# Patient Record
Sex: Female | Born: 1980 | Hispanic: Yes | Marital: Single | Smoking: Former smoker
Health system: Southern US, Community
[De-identification: ages and names within clinical notes are randomized; demographics above are authoritative.]

## PROBLEM LIST (undated history)

## (undated) ENCOUNTER — Inpatient Hospital Stay (HOSPITAL_COMMUNITY): Payer: Self-pay

## (undated) DIAGNOSIS — I10 Essential (primary) hypertension: Secondary | ICD-10-CM

## (undated) DIAGNOSIS — N6001 Solitary cyst of right breast: Secondary | ICD-10-CM

## (undated) HISTORY — DX: Solitary cyst of right breast: N60.01

---
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
## 2007-01-13 ENCOUNTER — Ambulatory Visit (HOSPITAL_COMMUNITY): Admission: RE | Admit: 2007-01-13 | Discharge: 2007-01-13 | Payer: Self-pay | Admitting: Obstetrics and Gynecology

## 2007-01-27 ENCOUNTER — Ambulatory Visit (HOSPITAL_COMMUNITY): Admission: RE | Admit: 2007-01-27 | Discharge: 2007-01-27 | Payer: Self-pay | Admitting: Obstetrics and Gynecology

## 2007-04-07 ENCOUNTER — Ambulatory Visit (HOSPITAL_COMMUNITY): Admission: RE | Admit: 2007-04-07 | Discharge: 2007-04-07 | Payer: Self-pay | Admitting: Obstetrics and Gynecology

## 2010-07-12 ENCOUNTER — Encounter: Payer: Self-pay | Admitting: *Deleted

## 2015-05-07 DIAGNOSIS — N6001 Solitary cyst of right breast: Secondary | ICD-10-CM | POA: Insufficient documentation

## 2016-02-20 HISTORY — PX: DILATION AND CURETTAGE OF UTERUS: SHX78

## 2017-04-06 ENCOUNTER — Encounter (HOSPITAL_COMMUNITY): Payer: Self-pay | Admitting: *Deleted

## 2017-04-06 ENCOUNTER — Inpatient Hospital Stay (HOSPITAL_COMMUNITY): Payer: Self-pay

## 2017-04-06 ENCOUNTER — Inpatient Hospital Stay (HOSPITAL_COMMUNITY)
Admission: AD | Admit: 2017-04-06 | Discharge: 2017-04-06 | Disposition: A | Discharge status: Home / Self Care | Payer: Self-pay | Source: Ambulatory Visit | Attending: Obstetrics and Gynecology | Admitting: Obstetrics and Gynecology

## 2017-04-06 DIAGNOSIS — O26899 Other specified pregnancy related conditions, unspecified trimester: Secondary | ICD-10-CM

## 2017-04-06 DIAGNOSIS — O3680X0 Pregnancy with inconclusive fetal viability, not applicable or unspecified: Secondary | ICD-10-CM

## 2017-04-06 DIAGNOSIS — R109 Unspecified abdominal pain: Secondary | ICD-10-CM

## 2017-04-06 DIAGNOSIS — Z62819 Personal history of unspecified abuse in childhood: Secondary | ICD-10-CM

## 2017-04-06 DIAGNOSIS — O26891 Other specified pregnancy related conditions, first trimester: Secondary | ICD-10-CM

## 2017-04-06 DIAGNOSIS — Z3A01 Less than 8 weeks gestation of pregnancy: Secondary | ICD-10-CM | POA: Insufficient documentation

## 2017-04-06 DIAGNOSIS — O10011 Pre-existing essential hypertension complicating pregnancy, first trimester: Secondary | ICD-10-CM | POA: Insufficient documentation

## 2017-04-06 DIAGNOSIS — O10911 Unspecified pre-existing hypertension complicating pregnancy, first trimester: Secondary | ICD-10-CM

## 2017-04-06 HISTORY — DX: Essential (primary) hypertension: I10

## 2017-04-06 LAB — URINALYSIS, ROUTINE W REFLEX MICROSCOPIC
Bilirubin Urine: NEGATIVE
GLUCOSE, UA: NEGATIVE mg/dL
Hgb urine dipstick: NEGATIVE
KETONES UR: NEGATIVE mg/dL
LEUKOCYTES UA: NEGATIVE
Nitrite: NEGATIVE
PROTEIN: NEGATIVE mg/dL
Specific Gravity, Urine: 1.025 (ref 1.005–1.030)
pH: 6 (ref 5.0–8.0)

## 2017-04-06 LAB — WET PREP, GENITAL
Clue Cells Wet Prep HPF POC: NONE SEEN
Sperm: NONE SEEN
TRICH WET PREP: NONE SEEN
Yeast Wet Prep HPF POC: NONE SEEN

## 2017-04-06 LAB — HCG, QUANTITATIVE, PREGNANCY: HCG, BETA CHAIN, QUANT, S: 628 m[IU]/mL — AB (ref <5)

## 2017-04-06 MED ORDER — LABETALOL HCL 100 MG PO TABS: 100.0000 mg | ORAL_TABLET | Freq: Two times a day (BID) | ORAL | 3 refills | Status: DC

## 2017-04-06 NOTE — MAU Note (Signed)
[facility]. [facility]. She also reports lower left abdomianl pain that started one week ago.

## 2017-04-06 NOTE — Discharge Instructions (Signed)
Dolor abdominal en el embarazo °(Abdominal Pain During Pregnancy) °El dolor abdominal es frecuente durante el embarazo. Generalmente no causa ningún daño. El dolor abdominal puede tener numerosas causas. Algunas causas son más graves que otras. Ciertas causas de dolor abdominal durante el embarazo se diagnostican fácilmente. A veces, se tarda un tiempo para llegar al diagnóstico. Otras veces la causa no se conoce. El dolor abdominal puede estar relacionado con alguna alteración del embarazo, o puede deberse a una causa totalmente diferente. Por este motivo, siempre consulte a su médico cuando sienta molestias abdominales. °INSTRUCCIONES PARA EL CUIDADO EN EL HOGAR  °Esté atenta al dolor para ver si hay cambios. Las siguientes indicaciones ayudarán a aliviar cualquier molestia que pueda sentir: °· No tenga relaciones sexuales y no coloque nada dentro de la vagina hasta que los síntomas hayan desaparecido completamente. °· Descanse todo lo que pueda hasta que el dolor se le haya calmado. °· Si siente náuseas, beba líquidos claros. Evite los alimentos sólidos mientras sienta malestar o tenga náuseas. °· Tome sólo medicamentos de venta libre o recetados, según las indicaciones del médico. °· Cumpla con todas las visitas de control, según le indique su médico. °SOLICITE ATENCIÓN MÉDICA DE INMEDIATO SI: °· Tiene un sangrado, pérdida de líquidos o elimina tejidos por la vagina. °· El dolor o los cólicos aumentan. °· Tiene vómitos persistentes. °· Comienza a sentir dolor al orinar u observa sangre. °· Tiene fiebre. °· Nota que los movimientos del bebé disminuyen. °· Siente intensa debilidad o se marea. °· Tiene dificultad para respirar con o sin dolor abdominal. °· Siente un dolor de cabeza intenso junto al dolor abdominal. °· Tiene una secreción vaginal anormal con dolor abdominal. °· Tiene diarrea persistente. °· El dolor abdominal sigue o empeora aún después de hacer reposo. °ASEGÚRESE DE QUE:  °· Comprende estas  instrucciones. °· Controlará su afección. °· Recibirá ayuda de inmediato si no mejora o si empeora. °Esta información no tiene como fin reemplazar el consejo del médico. Asegúrese de hacerle al médico cualquier pregunta que tenga. °Document Released: 06/07/2005 Document Revised: 09/29/2015 °Elsevier Interactive Patient Education © 2017 Elsevier Inc. ° °
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
## 2017-04-13 ENCOUNTER — Inpatient Hospital Stay (HOSPITAL_COMMUNITY)
Admission: AD | Admit: 2017-04-13 | Discharge: 2017-04-13 | Disposition: A | Discharge status: Home / Self Care | Payer: Self-pay | Source: Ambulatory Visit | Attending: Obstetrics and Gynecology | Admitting: Obstetrics and Gynecology

## 2017-04-13 ENCOUNTER — Inpatient Hospital Stay (HOSPITAL_COMMUNITY): Payer: Self-pay

## 2017-04-13 ENCOUNTER — Encounter (HOSPITAL_COMMUNITY): Payer: Self-pay | Admitting: Student

## 2017-04-13 ENCOUNTER — Ambulatory Visit (INDEPENDENT_AMBULATORY_CARE_PROVIDER_SITE_OTHER): Payer: Self-pay | Admitting: Advanced Practice Midwife

## 2017-04-13 DIAGNOSIS — O3680X0 Pregnancy with inconclusive fetal viability, not applicable or unspecified: Secondary | ICD-10-CM

## 2017-04-13 DIAGNOSIS — Z3A01 Less than 8 weeks gestation of pregnancy: Secondary | ICD-10-CM | POA: Insufficient documentation

## 2017-04-13 DIAGNOSIS — O10011 Pre-existing essential hypertension complicating pregnancy, first trimester: Secondary | ICD-10-CM | POA: Insufficient documentation

## 2017-04-13 DIAGNOSIS — O10911 Unspecified pre-existing hypertension complicating pregnancy, first trimester: Secondary | ICD-10-CM

## 2017-04-13 DIAGNOSIS — O26891 Other specified pregnancy related conditions, first trimester: Secondary | ICD-10-CM

## 2017-04-13 DIAGNOSIS — R1032 Left lower quadrant pain: Secondary | ICD-10-CM | POA: Insufficient documentation

## 2017-04-13 DIAGNOSIS — O26899 Other specified pregnancy related conditions, unspecified trimester: Secondary | ICD-10-CM

## 2017-04-13 DIAGNOSIS — R109 Unspecified abdominal pain: Secondary | ICD-10-CM

## 2017-04-13 DIAGNOSIS — Z3491 Encounter for supervision of normal pregnancy, unspecified, first trimester: Secondary | ICD-10-CM

## 2017-04-13 DIAGNOSIS — O99331 Smoking (tobacco) complicating pregnancy, first trimester: Secondary | ICD-10-CM | POA: Insufficient documentation

## 2017-04-13 DIAGNOSIS — F17210 Nicotine dependence, cigarettes, uncomplicated: Secondary | ICD-10-CM | POA: Insufficient documentation

## 2017-04-13 LAB — POCT URINALYSIS DIP (DEVICE)
Bilirubin Urine: NEGATIVE
Glucose, UA: NEGATIVE mg/dL
HGB URINE DIPSTICK: NEGATIVE
KETONES UR: NEGATIVE mg/dL
Leukocytes, UA: NEGATIVE
Nitrite: NEGATIVE
PH: 5.5 (ref 5.0–8.0)
PROTEIN: NEGATIVE mg/dL
SPECIFIC GRAVITY, URINE: 1.015 (ref 1.005–1.030)
Urobilinogen, UA: 0.2 mg/dL (ref 0.0–1.0)

## 2017-04-13 LAB — URINALYSIS, ROUTINE W REFLEX MICROSCOPIC
Bilirubin Urine: NEGATIVE
Glucose, UA: NEGATIVE mg/dL
HGB URINE DIPSTICK: NEGATIVE
Ketones, ur: NEGATIVE mg/dL
Leukocytes, UA: NEGATIVE
NITRITE: NEGATIVE
PROTEIN: NEGATIVE mg/dL
Specific Gravity, Urine: 1.025 (ref 1.005–1.030)
pH: 5 (ref 5.0–8.0)

## 2017-04-13 LAB — CBC
HEMATOCRIT: 39.1 % (ref 36.0–46.0)
Hemoglobin: 13.4 g/dL (ref 12.0–15.0)
MCH: 31.8 pg (ref 26.0–34.0)
MCHC: 34.3 g/dL (ref 30.0–36.0)
MCV: 92.7 fL (ref 78.0–100.0)
PLATELETS: 291 10*3/uL (ref 150–400)
RBC: 4.22 MIL/uL (ref 3.87–5.11)
RDW: 13.3 % (ref 11.5–15.5)
WBC: 10.1 10*3/uL (ref 4.0–10.5)

## 2017-04-13 LAB — HCG, QUANTITATIVE, PREGNANCY: HCG, BETA CHAIN, QUANT, S: 9882 m[IU]/mL — AB (ref <5)

## 2017-04-13 LAB — ABO/RH: ABO/RH(D): O POS

## 2017-04-13 NOTE — Progress Notes (Signed)
Subjective:     Patient ID: Julie Ellis, female   DOB: 03/01/1981, 36 y.o.   MRN: [id number]  Julie Jeffersonely Ellis is a 36 y.o. [id number] at Unknown gestational age who presents left lower quadrant pain. [facility] of [facility] and diagnosed with a miscarriage.  Patient states she had heavy bleeding with clots for 2-3 hours after being seen with cramping that has since stopped. She states the first week of October she had 3 days of bleeding that she feels like was a period and none since. Patient has paperwork with her from 2017 that matches what she describes, but patient is saying this occurred last month.   Currently, she is reporting left lower quadrant pain that has continued since her miscarriage. She rates the pain a 4/10 and describes it as burning. She has not tried anything for the pain. She states [facility] who told her she was pregnant and recommended she come to MAU to be evaluated for high blood pressure. On 10/17, patient had a hcg of 628 and an ultrasound with a possible gestational sac and no yolk sac. Patient was supposed to follow up in 48 hours for a hcg but did not come back because she thought she was supposed to establish prenatal care.     Review of Systems  Constitutional: Positive for fatigue. Negative for activity change and fever.  Gastrointestinal: Positive for abdominal pain.  Genitourinary: Negative for vaginal bleeding and vaginal discharge.  Psychiatric/Behavioral: Negative.        Objective:   Physical Exam  Constitutional: She is oriented to person, place, and time. She appears well-developed and well-nourished.  HENT:  Head: Normocephalic.  Cardiovascular: Normal rate, regular rhythm and normal heart sounds.   Pulmonary/Chest: Effort normal and breath sounds normal.  Abdominal: Soft. Bowel sounds are normal. She exhibits no distension. There is tenderness in the suprapubic  area and left lower quadrant.  Neurological: She is alert and oriented to person, place, and time.  Skin: Skin is warm and dry.  Psychiatric: She has a normal mood and affect. Her behavior is normal. Judgment and thought content normal.  Nursing note and vitals reviewed.     Assessment:     1. Pregnancy of unknown anatomic location   2. Left lower quadrant pain       Plan:     -Patient to MAU for further evaluation and management  Rolm BookbinderCaroline M Neill, CNM 04/13/17 3:30 PM

## 2017-04-13 NOTE — Discharge Instructions (Signed)
Dolor abdominal en el embarazo °(Abdominal Pain During Pregnancy) °El dolor abdominal es frecuente durante el embarazo. Generalmente no causa ningún daño. El dolor abdominal puede tener numerosas causas. Algunas causas son más graves que otras. Ciertas causas de dolor abdominal durante el embarazo se diagnostican fácilmente. A veces, se tarda un tiempo para llegar al diagnóstico. Otras veces la causa no se conoce. El dolor abdominal puede estar relacionado con alguna alteración del embarazo, o puede deberse a una causa totalmente diferente. Por este motivo, siempre consulte a su médico cuando sienta molestias abdominales. °INSTRUCCIONES PARA EL CUIDADO EN EL HOGAR  °Esté atenta al dolor para ver si hay cambios. Las siguientes indicaciones ayudarán a aliviar cualquier molestia que pueda sentir: °· No tenga relaciones sexuales y no coloque nada dentro de la vagina hasta que los síntomas hayan desaparecido completamente. °· Descanse todo lo que pueda hasta que el dolor se le haya calmado. °· Si siente náuseas, beba líquidos claros. Evite los alimentos sólidos mientras sienta malestar o tenga náuseas. °· Tome sólo medicamentos de venta libre o recetados, según las indicaciones del médico. °· Cumpla con todas las visitas de control, según le indique su médico. °SOLICITE ATENCIÓN MÉDICA DE INMEDIATO SI: °· Tiene un sangrado, pérdida de líquidos o elimina tejidos por la vagina. °· El dolor o los cólicos aumentan. °· Tiene vómitos persistentes. °· Comienza a sentir dolor al orinar u observa sangre. °· Tiene fiebre. °· Nota que los movimientos del bebé disminuyen. °· Siente intensa debilidad o se marea. °· Tiene dificultad para respirar con o sin dolor abdominal. °· Siente un dolor de cabeza intenso junto al dolor abdominal. °· Tiene una secreción vaginal anormal con dolor abdominal. °· Tiene diarrea persistente. °· El dolor abdominal sigue o empeora aún después de hacer reposo. °ASEGÚRESE DE QUE:  °· Comprende estas  instrucciones. °· Controlará su afección. °· Recibirá ayuda de inmediato si no mejora o si empeora. °Esta información no tiene como fin reemplazar el consejo del médico. Asegúrese de hacerle al médico cualquier pregunta que tenga. °Document Released: 06/07/2005 Document Revised: 09/29/2015 °Elsevier Interactive Patient Education © 2017 Elsevier Inc. ° °

## 2017-04-13 NOTE — Patient Instructions (Signed)
Embarazo ectópico °(Ectopic Pregnancy) °Un embarazo ectópico ocurre cuando un óvulo fecundado se desarrolla fuera del útero. Un embarazo no puede subsistir fuera del útero. Este problema generalmente ocurre en las trompas de Falopio. La causa es, con frecuencia, un daño en la trompa de Falopio. °Si el problema se detecta a tiempo, puede tratarse con medicamentos. Si el conducto se fisura o estalla (hay ruptura), tendrá una hemorragia interna. Esto es una emergencia. Deberá someterse a una cirugía. Solicite ayuda de inmediato. °SÍNTOMAS °Al principio puede tener síntomas de un embarazo normal. Estos pueden ser: °· Falta del período menstrual. °· Ganas de vomitar (náuseas). °· Sensación de cansancio. °· Hinchazón de las mamas. °Luego podrá a comenzar a tener síntomas que no son normales. Estos pueden ser: °· Dolor durante el coito (relación sexual). °· Hemorragia por la vagina. Esto incluye el sangrado leve (pérdidas). °· Calambres o dolor en el vientre (abdomen) o en la zona inferior del vientre. Estos pueden sentirse en uno de los lados. °· Latidos cardíacos rápidos (pulso). °· Perder la conciencia (desmayarse) después de ir de cuerpo (defecar). °Si el conducto se rompe, puede tener síntomas como: °· Dolor muy intenso en el vientre o parte baja del vientre. Esto se produce repentinamente. °· Mareos. °· Desmayo. °· Dolor en el hombro. °SOLICITE AYUDA DE INMEDIATO SI: °Tiene alguno de estos síntomas. Esto es una emergencia. °ASEGÚRESE DE QUE: °· Comprende estas instrucciones. °· Controlará su afección. °· Recibirá ayuda de inmediato si no mejora o si empeora. ° °Esta información no tiene como fin reemplazar el consejo del médico. Asegúrese de hacerle al médico cualquier pregunta que tenga. °Document Released: 05/27/2011 Document Revised: 06/12/2013 Document Reviewed: 01/17/2013 °Elsevier Interactive Patient Education © 2017 Elsevier Inc. ° °

## 2017-04-13 NOTE — MAU Note (Signed)
Sent up from clinic due to hypertension.

## 2017-04-13 NOTE — MAU Provider Note (Signed)
History     CSN: [id number]  Arrival date and time: 04/13/17 1533   First Provider Initiated Contact with Patient 04/13/17 1614      Chief Complaint  Patient presents with  . Abdominal Pain   HPI Julie Ellis is a 36 y.o. [id number] at 2746w2d who presents from the clinic for ectopic workup. Patient seen in MAU last week; had ultrasound that showed questionable IUGS & a BHCG of ~600. Was instructed to f/u in 48 hours for repeat BHCG but thought she was told to start prenatal care. Went for initial OB visit in the clinic today & told provider about abdominal pain so was sent here for stat labs & ultrasound.  Reports intermittent suprapubic & LLQ pain for the last few days. Rates pain 7/10. Has not treated. Denies vomiting, diarrhea, constipation, dysuria, vaginal bleeding, or vaginal discharge.  OB History    Gravida Para Term Preterm AB Living   5 3 3   1 3    SAB TAB Ectopic Multiple Live Births   1       3      Past Medical History:  Diagnosis Date  . Hypertension    took herself off meds 1 1/2years ago    Past Surgical History:  Procedure Laterality Date  . DILATION AND CURETTAGE OF UTERUS      History reviewed. No pertinent family history.  Social History  Substance Use Topics  . Smoking status: Current Every Day Smoker    Packs/day: 0.50    Types: Cigarettes  . Smokeless tobacco: Never Used  . Alcohol use No    Allergies: No Known Allergies  Prescriptions Prior to Admission  Medication Sig Dispense Refill Last Dose  . folic acid (FOLVITE) 800 MCG tablet Take 800 mcg by mouth daily.     Marland Kitchen. labetalol (NORMODYNE) 100 MG tablet Take 1 tablet (100 mg total) by mouth 2 (two) times daily. 60 tablet 3     Review of Systems  Constitutional: Negative for chills and fever.  Gastrointestinal: Positive for abdominal pain and nausea. Negative for constipation, diarrhea and vomiting.  Genitourinary: Negative for dysuria, vaginal bleeding and vaginal discharge.    Physical Exam   Blood pressure (!) 123/91, pulse 79, temperature 97.8 F (36.6 C), temperature source Oral, resp. rate 16, last menstrual period 03/07/2017.  Physical Exam  Nursing note and vitals reviewed. Constitutional: She is oriented to person, place, and time. She appears well-developed and well-nourished. No distress.  HENT:  Head: Normocephalic and atraumatic.  Eyes: Conjunctivae are normal. Right eye exhibits no discharge. Left eye exhibits no discharge. No scleral icterus.  Neck: Normal range of motion.  Respiratory: Effort normal. No respiratory distress.  GI: Soft. She exhibits no distension. There is no tenderness. There is no rebound and no guarding.  Neurological: She is alert and oriented to person, place, and time.  Skin: Skin is warm and dry. She is not diaphoretic.  Psychiatric: She has a normal mood and affect. Her behavior is normal. Judgment and thought content normal.    MAU Course  Procedures Results for orders placed or performed during the hospital encounter of 04/13/17 (from the past 24 hour(s))  Urinalysis, Routine w reflex microscopic     Status: Abnormal   Collection Time: 04/13/17  3:33 PM  Result Value Ref Range   Color, Urine YELLOW YELLOW   APPearance CLEAR CLEAR   Specific Gravity, Urine 1.025 1.005 - 1.030   pH 5.0 5.0 - 8.0   Glucose, UA  NEGATIVE NEGATIVE mg/dL   Hgb urine dipstick NEGATIVE NEGATIVE   Bilirubin Urine NEGATIVE NEGATIVE   Ketones, ur NEGATIVE NEGATIVE mg/dL   Protein, ur NEGATIVE NEGATIVE mg/dL   Nitrite NEGATIVE NEGATIVE   Leukocytes, UA NEGATIVE NEGATIVE   RBC / HPF 0-5 0 - 5 RBC/hpf   WBC, UA 0-5 0 - 5 WBC/hpf   Bacteria, UA RARE (A) NONE SEEN   Squamous Epithelial / LPF 0-5 (A) NONE SEEN   Mucus PRESENT   CBC     Status: None   Collection Time: 04/13/17  4:26 PM  Result Value Ref Range   WBC 10.1 4.0 - 10.5 K/uL   RBC 4.22 3.87 - 5.11 MIL/uL   Hemoglobin 13.4 12.0 - 15.0 g/dL   HCT 40.9 81.1 - 91.4 %   MCV  92.7 78.0 - 100.0 fL   MCH 31.8 26.0 - 34.0 pg   MCHC 34.3 30.0 - 36.0 g/dL   RDW 78.2 95.6 - 21.3 %   Platelets 291 150 - 400 K/uL  ABO/Rh     Status: None (Preliminary result)   Collection Time: 04/13/17  4:26 PM  Result Value Ref Range   ABO/RH(D) O POS   hCG, quantitative, pregnancy     Status: Abnormal   Collection Time: 04/13/17  4:26 PM  Result Value Ref Range   hCG, Beta Chain, Quant, S 9,882 (H) <5 mIU/mL   US Ob Transvaginal  Result Date: 04/13/2017 CLINICAL DATA:  Vaginal bleeding in first trimester pregnancy, abdominal pain EXAM: TRANSVAGINAL OB ULTRASOUND TECHNIQUE: Transvaginal ultrasound was performed for complete evaluation of the gestation as well as the maternal uterus, adnexal regions, and pelvic cul-de-sac. COMPARISON:  04/06/2017 FINDINGS: Intrauterine gestational sac: Present Yolk sac:  Present Embryo:  Not identified Cardiac Activity: N/A Heart Rate: N/A bpm MSD: 9.5  mm   5 w   5  d Subchorionic hemorrhage:  Small subchronic hemorrhage 10 x 5 x 9 mm Maternal uterus/adnexae: RIGHT ovary normal size and morphology 4.1 x 2.0 x 2.7 cm. LEFT ovary normal size and morphology 2.2 x 0.9 x 1.8 cm. Trace free pelvic fluid. No adnexal masses. IMPRESSION: Intrauterine gestational sac identified containing a yolk sac. No definite fetal pole visualized to establish viability. Small subchronic hemorrhage. Electronically Signed   By: Ulyses Southward M.D.   On: 04/13/2017 17:39    MDM CBC, BHCG, abo/rh, ultrasound Ultrasound shows IUGS with yolk sac which confirms intrauterine pregnancy Discussed results with patient. Will have her reschedule her new OB visit Pt reports resolution of pain without intervention Assessment and Plan  A: 1. Normal IUP (intrauterine pregnancy) on prenatal ultrasound, first trimester   2. Abdominal pain affecting pregnancy   3. Maternal chronic hypertension in first trimester   4. Less than [redacted] weeks gestation of pregnancy    P: Discharge home Discussed  reasons to return to MAU Msg to [facility] to reschedule initial OB visit   Judeth Horn 04/13/2017, 4:15 PM

## 2017-04-13 NOTE — MAU Note (Signed)
Sent from clinic, initial prenatal appt today.  ? New preg, reports SAB end of Sept.  Presents today with LLQ pain.

## 2017-04-28 ENCOUNTER — Telehealth: Payer: Self-pay | Admitting: General Practice

## 2017-04-28 NOTE — Telephone Encounter (Signed)
Called and notified patient (with Interpreter) of New OB appointment with Thressa ShellerHeather Hogan, CNM on 05/23/17 at 9:20am.  Patient verbalized understanding.

## 2017-05-23 ENCOUNTER — Encounter: Payer: Self-pay | Admitting: Advanced Practice Midwife

## 2017-05-23 ENCOUNTER — Encounter: Payer: Self-pay | Admitting: General Practice

## 2017-05-23 ENCOUNTER — Ambulatory Visit (INDEPENDENT_AMBULATORY_CARE_PROVIDER_SITE_OTHER): Payer: Self-pay | Admitting: Advanced Practice Midwife

## 2017-05-23 DIAGNOSIS — O10911 Unspecified pre-existing hypertension complicating pregnancy, first trimester: Secondary | ICD-10-CM

## 2017-05-23 DIAGNOSIS — O0991 Supervision of high risk pregnancy, unspecified, first trimester: Secondary | ICD-10-CM

## 2017-05-23 DIAGNOSIS — N6001 Solitary cyst of right breast: Secondary | ICD-10-CM

## 2017-05-23 DIAGNOSIS — O10919 Unspecified pre-existing hypertension complicating pregnancy, unspecified trimester: Secondary | ICD-10-CM | POA: Insufficient documentation

## 2017-05-23 DIAGNOSIS — Z113 Encounter for screening for infections with a predominantly sexual mode of transmission: Secondary | ICD-10-CM

## 2017-05-23 HISTORY — DX: Solitary cyst of right breast: N60.01

## 2017-05-23 LAB — POCT URINALYSIS DIP (DEVICE)
BILIRUBIN URINE: NEGATIVE
GLUCOSE, UA: NEGATIVE mg/dL
Hgb urine dipstick: NEGATIVE
Ketones, ur: NEGATIVE mg/dL
NITRITE: NEGATIVE
Protein, ur: NEGATIVE mg/dL
Specific Gravity, Urine: 1.025 (ref 1.005–1.030)
UROBILINOGEN UA: 0.2 mg/dL (ref 0.0–1.0)
pH: 6 (ref 5.0–8.0)

## 2017-05-23 MED ORDER — ASPIRIN EC 81 MG PO TBEC: 81.0000 mg | DELAYED_RELEASE_TABLET | Freq: Every day | ORAL | 9 refills | Status: DC

## 2017-05-23 MED ORDER — PRENATAL VITAMINS 0.8 MG PO TABS: 1.0000 | ORAL_TABLET | Freq: Every day | ORAL | 12 refills | Status: DC

## 2017-05-23 MED ORDER — LABETALOL HCL 200 MG PO TABS: 200.0000 mg | ORAL_TABLET | Freq: Two times a day (BID) | ORAL | 9 refills | Status: DC

## 2017-05-23 NOTE — Patient Instructions (Signed)
Plan de alimentacin para mujeres embarazadas (Eating Plan for Pregnant Women) Durante el [place] Glenvilleembarazo, el cuerpo Pension scheme managernecesitar recibir alimentacin extra para brindar sustento al beb que est creciendo. Se recomienda que consuma lo siguiente:  150caloras extra diariamente Physicist, medicaldurante el primer trimestre.  300caloras extra diariamente durante el segundo trimestre.  300caloras extra diariamente durante el tercer trimestre. Comer una dieta saludable y bien equilibrada es muy importante para su salud y la del beb. Adems, usted tiene ms necesidad de recibir algunas vitaminas y Erskineminerales, como cido flico, calcio, hierro y vitaminaD. QU DEBO SABER ACERCA DE LA ALIMENTACIN DURANTE EL EMBARAZO?  No trate de Geophysical data processoradelgazar o hacer una dieta durante el embarazo.  Elija alimentos nutritivos y saludables. Opte por la mitad de un sndwich con un vaso de Primary school teacherleche en lugar de una barra de Hillcrest Heightsdulce o una bebida endulzada con azcar que contenga muchas caloras.  Limite el consumo general de alimentos con "caloras vacas". Entre los alimentos con poco valor nutritivo se Office Depotincluyen los dulces, los postres, los Viequescaramelos, las bebidas endulzadas con International aid/development workerazcar y los alimentos fritos.  Consuma una amplia variedad de alimentos, especialmente frutas y verduras.  Tome una vitamina prenatal para ayudar a Patent examinersatisfacer las necesidades adicionales durante el [place] Cityembarazo, especficamente de cido flico, hierro, calcio y vitaminaD.  Haga actividad fsica. Pdale al mdico que le recomiende ejercicios que sean especficos para usted.  Cuide la salubridad y la higiene en relacin con los alimentos, por ejemplo, lvese las manos antes de comer y despus de preparar carne cruda. Esto ayuda a USAAevitar las enfermedades de origen alimentario, como la listeriosis, que puede ser muy peligrosa para el beb. Pdale al mdico ms informacin sobre esta enfermedad. CMO SE VEN 150CALORAS EXTRA? Las opciones saludables con 150caloras extra  diarias podran ser cualquiera de las siguientes:  Yogur natural con bajo contenido de grasa (6 a 8onzas [170 a 230g]) con taza de frutos rojos.  1manzana con 2cucharaditas de Singaporemantequilla de man.  Verduras cortadas en trozos con de taza de hummus.  Leche chocolatada con bajo contenido de grasa (8onzas [25040ml] o 1taza).  1tira de queso en hebras con 1naranja mediana.  emparedado de Singaporemantequilla de man y Ghanamermelada en pan integral (1cucharadita de Bowersvillemantequilla de man). Para llegar a 300caloras, podra comer dos de esas opciones saludables diariamente. CUL ES LA CANTIDAD SALUDABLE DE PESO QUE SE DEBE AUMENTAR? La cantidad de peso que es recomendable aumentar depende de su ndice de masa corporal [facility]) antes del embarazo. Si su I[facility] antes del embarazo:  Era menos de 6318 (bajo peso), debe aumentar de 28 a 40 libras (13 a 18kg).  Era de 6718 a 24,9 (normal), debe aumentar de 25 a 35libras (11 a 16kg).  Era de 25 a 29,9 (sobrepeso), debe aumentar de 15 a 25libras (7 a 11kg).  Era de ms de30 (obeso), debe aumentar de 11 a 20libras (5 a 10kg). QU SUCEDE SI ESTOY EMBARAZADA DE GEMELOS O MS BEBS? Generalmente, las embarazadas que tendrn gemelos o ms bebs tal vez deban aumentar la ingesta calrica diaria entre 300 y 600caloras. El rango de aumento de peso total recomendado es de 25 a 54libras (11 a 24kg), en funcin del IMC antes del embarazo. Hable con el mdico para obtener pautas especficas Rohm and Haassobre las necesidades nutritivas [address]adicionales, el aumento de peso y el ejercicio durante el Robinsonembarazo. QU ALIMENTOS PUEDO COMER? Cereales Atmos Energyodos los cereales. Intente elegir los cereales integrales, como el pan integral, la avena o el arroz integral. Verduras Todas las verduras. Intente consumir verduras  de colores y tipos diferentes para obtener una gama completa de vitaminas y minerales. Recuerde lavar bien las verduras antes de comerlas. Frutas Todas las frutas.  Intente consumir frutas de colores y tipos diferentes para obtener una gama completa de vitaminas y minerales. Recuerde lavar bien las frutas antes de comerlas. Carnes y [address]otras fuentes de protenas Carnes Farmlandmagras, entre ellas, [address], [place] Westpavo, pescado y cortes Evelynshiremagros de carne de [place] Pumpvaca, Belizeternera o cerdo. Asegrese de que todas las carnes estn bien cocidas. Tofu. Tempeh. Frijoles. Huevos. Mantequilla de man y Special educational needs teacherotras mantequillas de frutos secos. Mariscos, por ejemplo, camarones, cangrejo y Hernando Beachlangosta. Si opta por los pescados, elija aquellos que tienen un alto contenido de cidos grasos omega-3, incluidos el salmn, el arenque, los Sugar Citymejillones, la West Tawakonitrucha, las sardinas y el abadejo. Asegrese de que todas las carnes estn cocidas a temperaturas que las vuelvan aptas para el consumo. Lcteos Leche y derivados de la leche pasteurizados. Yogur y queso pasteurizados. CSX CorporationQueso cottage. PPG IndustriesCrema cida. CHS IncBebidas Agua. Jugos que contengan jugo de frutas o de verduras al 100%. Ts sin cafena y caf descafeinado. Se pueden tomar bebidas que contengan cafena, pero es mejor evitar esta sustancia. La ingesta total de cafena debe ser de menos de 200mg  diarios (12onzas [33755ml] de caf, t o refresco) o como el mdico se lo haya indicado. Condimentos Todos los condimentos pasteurizados. Dulces y NVR Incpostres Todos los dulces y los postres. Grasas y [address] Avenueaceites Todas las grasas y los aceites. Los artculos mencionados arriba pueden no ser Raytheonuna lista completa de las bebidas o los alimentos recomendados. Comunquese con el nutricionista para conocer ms opciones. QU ALIMENTOS NO SE RECOMIENDAN? Verduras Jugos de verduras no pasteurizados (crudos). Frutas Jugos de frutas no pasteurizados (crudos). Carnes y [address]ras fuentes de protenas Embutidos que contengan nitratos, como Boycepanceta, salame y perros calientes. Embutidos, mortadela u otros fiambres (a menos que estn recalentados al punto de emitir vapor). Pats refrigerados, pastas untables  de carne de una carnicera, frutos de mar ahumados que se encuentran en la seccin de productos refrigerados de Cameroonuna tienda. Pescado crudo, como sushi o sashimi. Pescados con alto contenido de mercurio, por ejemplo, azulejo, tiburn, pez espada y caballa. Carnes crudas, como atn o tartar de carne de res. Carnes y pollo a Merchant navy officermedio cocer. Asegrese de que todas las carnes estn cocidas a temperaturas que las vuelvan aptas para el consumo. Lcteos Leche no pasteurizada (cruda) y cualquier alimento que Omancontenga leche cruda. Quesos blandos, como feta, queso Paulblanco, Centervillequeso fresco, RondoBrie, quesos St. Marysamembert, quesos azules y Aeronautical engineerqueso panela (salvo que estn elaborados con leche pasteurizada, lo cual debe constar en la etiqueta). Bebidas Alcohol. Bebidas endulzadas con azcar, como refrescos, ts o bebidas energizantes. Condimentos Alimentos y bebidas caseros hechos en casa, como pickles, chucrut o bebidas con kombucha (se pueden tomar las versiones que estn pasteurizadas y se vendan en las tiendas). Otros Ensaladas preparadas en la tienda, como ensalada de Treynorjamn, de Altamontpollo, de Athenshuevo, de atn y de frutos de Investment banker, operationalmar. Los artculos mencionados arriba pueden no ser Raytheonuna lista completa de las bebidas y los alimentos que se Theatre stage managerdeben evitar. Comunquese con el nutricionista para obtener ms informacin. Esta informacin no tiene Theme park managercomo fin reemplazar el consejo del mdico. Asegrese de hacerle al mdico cualquier pregunta que tenga. Document Released: 03/22/2014 Document Revised: 09/29/2015 Document Reviewed: 11/20/2013 Elsevier Interactive Patient Education  2018 ArvinMeritorElsevier Inc.  Informacin sobre el dispositivo intrauterino (Intrauterine Device Information) Un dispositivo intrauterino (DIU) se inserta en el tero e impide el embarazo. Hay dos tipos de DIU:  DIU de cobre: este tipo de DIU est recubierto con un alambre de cobre y se inserta dentro del tero. El cobre hace que el tero y las trompas de Falopio produzcan un liquido  que [employer] espermatozoides. El DIU de cobre puede Geneticist, molecular durante 10 aos.  DIU con hormona: este tipo de DIU contiene la hormona progestina (progesterona sinttica). Las hormonas hacen que el moco cervical se haga ms espeso, lo que evita que el esperma ingrese al tero. Tambin hace que la membrana que recubre internamente al tero sea ms delgada lo que impide el implante del vulo fertilizado. La hormona debilita o destruye los espermatozoides que ingresan al tero. Alguno de los tipos de DIU hormonal pueden Geneticist, molecular durante 5 aos y otros tipos pueden dejarse en el lugar por 3 aos. El mdico se asegurar de que usted sea una buena candidata para usar el DIU. Converse con su mdico acerca de los posibles efectos secundarios. VENTAJASDEL DISPOSITIVO INTRAUTERINO  El DIU es muy eficaz, reversible, de accin prolongada y de bajo mantenimiento.  No hay efectos secundarios relacionados con el estrgeno.  El DIU puede ser utilizado durante la Market researcher.  No est asociado con el aumento de [place].  Funciona inmediatamente despus de la insercin.  El DIU hormonal funciona inmediatamente si se inserta dentro de los [address] del inicio del perodo. Ser necesario que utilice un mtodo anticonceptivo adicional durante 7 das si el DIU hormonal se inserta en algn otro momento del ciclo.  El DIU de cobre no interfiere con las hormonas femeninas.  El DIU hormonal puede hacer que los perodos menstruales abundantes se hagan ms ligeros y que haya menos clicos.  El DIU hormonal puede usarse durante 3 a 5 aos.  El DIU de cobre puede usarse durante 10 aos.  DESVENTAJASDEL DISPOSITIVO [employer] DIU hormonal puede estar asociado con patrones de sangrado irregular.  El DIU de cobre puede hacer que el flujo menstrual ms abundante y doloroso.  Puede experimentar clicos y sangrado vaginal despus de la insercin.  Esta informacin no tiene Microbiologist el consejo del mdico. Asegrese de hacerle al mdico cualquier pregunta que tenga. Document Released: 11/25/2009 Document Revised: 09/29/2015 Document Reviewed: 11/26/2012 Elsevier Interactive Patient Education  2017 ArvinMeritor.

## 2017-05-23 NOTE — Progress Notes (Signed)
[employer] interpreter VF CorporationMarlen Ellis

## 2017-05-23 NOTE — Progress Notes (Signed)
  Subjective:    Julie Ellis is being seen today for her first obstetrical visit.  This is a planned pregnancy. She is at 7363w0d gestation. Her obstetrical history is significant for advanced maternal age and chronic hypertension on labetalol . Relationship with FOB: spouse, living together. Patient does intend to breast feed. Pregnancy history fully reviewed.  Patient reports no contractions and no cramping.  Review of Systems:   Review of Systems  Constitutional: Negative for appetite change, chills and fever.  Respiratory: Negative for cough, shortness of breath and wheezing.   Gastrointestinal: Negative for abdominal distention, abdominal pain, constipation, diarrhea, nausea and vomiting.  Genitourinary: Negative for difficulty urinating, dysuria, frequency, pelvic pain, vaginal bleeding, vaginal discharge and vaginal pain.  Musculoskeletal: Negative for back pain.  Neurological: Negative for dizziness, weakness, light-headedness and headaches.  Psychiatric/Behavioral: Negative for agitation, behavioral problems and confusion.    Objective:     BP (!) 142/102   Pulse 85   Ht 5\' 2"  (1.575 m)   Wt 151 lb 8 oz (68.7 kg)   LMP 03/07/2017 (Exact Date)   BMI 27.71 kg/m  Physical Exam  Constitutional: She is oriented to person, place, and time. She appears well-developed and well-nourished.  Cardiovascular: Normal rate, regular rhythm and normal heart sounds.  Respiratory: Effort normal and breath sounds normal. No respiratory distress. Right breast exhibits mass. Right breast exhibits no inverted nipple and no nipple discharge. Left breast exhibits no inverted nipple, no mass, no nipple discharge and no skin change.    GI: Soft. Bowel sounds are normal. She exhibits no distension. There is no tenderness.  Genitourinary: No vaginal discharge found.  Neurological: She is alert and oriented to person, place, and time.    Maternal Exam:  Abdomen: Patient reports no abdominal  tenderness. Fundal height is below SP.    Introitus: Vagina is negative for discharge.  Pelvis: adequate for delivery.   Cervix: not evaluated.    Assessment:    Pregnancy: [id number] Patient Active Problem List   Diagnosis Date Noted  . Supervision of high risk pregnancy, antepartum, first trimester 05/23/2017  . Chronic hypertension during pregnancy, antepartum 05/23/2017  . Breast cyst, right 05/07/2015       Plan:     Initial labs drawn. Prenatal vitamins. Aspirin 81mg  PO tablet daily  Labetalol 100mg  BID changed to 200mg  BID- BP in office today 142/102 Problem list reviewed and updated. AFP3 discussed: undecided. Request first trimester genetic screening which was scheduled for 13 weeks  Role of ultrasound in pregnancy discussed; fetal survey: requested. Amniocentesis discussed: not indicated. Follow up in 4 weeks. Will obtain records from [facility] for most recent PAP and [facility] evaluation  50% of 30 min visit spent on counseling and coordination of care.   Sharyon CableVeronica C Tapanga Ottaway CNM 05/23/2017

## 2017-05-24 LAB — OBSTETRIC PANEL, INCLUDING HIV
ANTIBODY SCREEN: NEGATIVE
BASOS ABS: 0 10*3/uL (ref 0.0–0.2)
BASOS: 0 %
EOS (ABSOLUTE): 0.1 10*3/uL (ref 0.0–0.4)
Eos: 1 %
HEMATOCRIT: 39.8 % (ref 34.0–46.6)
HEP B S AG: NEGATIVE
HIV SCREEN 4TH GENERATION: NONREACTIVE
Hemoglobin: 13 g/dL (ref 11.1–15.9)
Immature Grans (Abs): 0 10*3/uL (ref 0.0–0.1)
Immature Granulocytes: 0 %
LYMPHS ABS: 1.8 10*3/uL (ref 0.7–3.1)
Lymphs: 18 %
MCH: 30.4 pg (ref 26.6–33.0)
MCHC: 32.7 g/dL (ref 31.5–35.7)
MCV: 93 fL (ref 79–97)
MONOCYTES: 8 %
Monocytes Absolute: 0.8 10*3/uL (ref 0.1–0.9)
NEUTROS ABS: 7.5 10*3/uL — AB (ref 1.4–7.0)
Neutrophils: 73 %
PLATELETS: 362 10*3/uL (ref 150–379)
RBC: 4.28 x10E6/uL (ref 3.77–5.28)
RDW: 13.6 % (ref 12.3–15.4)
RH TYPE: POSITIVE
RPR: NONREACTIVE
RUBELLA: 1.42 {index} (ref >0.99)
WBC: 10.3 10*3/uL (ref 3.4–10.8)

## 2017-05-24 LAB — GC/CHLAMYDIA PROBE AMP (~~LOC~~) NOT AT ARMC
CHLAMYDIA, DNA PROBE: NEGATIVE
NEISSERIA GONORRHEA: NEGATIVE

## 2017-05-24 LAB — HEMOGLOBINOPATHY EVALUATION
HEMOGLOBIN F QUANTITATION: 0 % (ref 0.0–2.0)
HGB C: 0 %
HGB S: 0 %
HGB VARIANT: 0 %
Hemoglobin A2 Quantitation: 2.3 % (ref 1.8–3.2)
Hgb A: 97.7 % (ref 96.4–98.8)

## 2017-05-25 LAB — CULTURE, OB URINE

## 2017-05-25 LAB — URINE CULTURE, OB REFLEX

## 2017-06-01 ENCOUNTER — Encounter (HOSPITAL_COMMUNITY): Payer: Self-pay | Admitting: Advanced Practice Midwife

## 2017-06-08 ENCOUNTER — Ambulatory Visit (HOSPITAL_COMMUNITY)
Admission: RE | Admit: 2017-06-08 | Discharge: 2017-06-08 | Disposition: A | Discharge status: Home / Self Care | Payer: Self-pay | Source: Ambulatory Visit | Attending: Advanced Practice Midwife | Admitting: Advanced Practice Midwife

## 2017-06-08 ENCOUNTER — Encounter (HOSPITAL_COMMUNITY): Payer: Self-pay

## 2017-06-08 ENCOUNTER — Other Ambulatory Visit: Payer: Self-pay | Admitting: Advanced Practice Midwife

## 2017-06-08 DIAGNOSIS — Z3682 Encounter for antenatal screening for nuchal translucency: Secondary | ICD-10-CM

## 2017-06-08 DIAGNOSIS — O09521 Supervision of elderly multigravida, first trimester: Secondary | ICD-10-CM | POA: Insufficient documentation

## 2017-06-08 DIAGNOSIS — O10919 Unspecified pre-existing hypertension complicating pregnancy, unspecified trimester: Secondary | ICD-10-CM

## 2017-06-08 DIAGNOSIS — O0991 Supervision of high risk pregnancy, unspecified, first trimester: Secondary | ICD-10-CM | POA: Insufficient documentation

## 2017-06-08 DIAGNOSIS — Z3A13 13 weeks gestation of pregnancy: Secondary | ICD-10-CM

## 2017-06-20 ENCOUNTER — Other Ambulatory Visit (HOSPITAL_COMMUNITY): Payer: Self-pay

## 2017-06-20 ENCOUNTER — Ambulatory Visit (INDEPENDENT_AMBULATORY_CARE_PROVIDER_SITE_OTHER): Payer: Self-pay | Admitting: Obstetrics and Gynecology

## 2017-06-20 VITALS — BP 130/90 | HR 86 | Wt 150.0 lb

## 2017-06-20 DIAGNOSIS — O10919 Unspecified pre-existing hypertension complicating pregnancy, unspecified trimester: Secondary | ICD-10-CM

## 2017-06-20 DIAGNOSIS — O0992 Supervision of high risk pregnancy, unspecified, second trimester: Secondary | ICD-10-CM

## 2017-06-20 DIAGNOSIS — O0991 Supervision of high risk pregnancy, unspecified, first trimester: Secondary | ICD-10-CM

## 2017-06-20 DIAGNOSIS — O10912 Unspecified pre-existing hypertension complicating pregnancy, second trimester: Secondary | ICD-10-CM

## 2017-06-20 DIAGNOSIS — O09522 Supervision of elderly multigravida, second trimester: Secondary | ICD-10-CM

## 2017-06-20 NOTE — Progress Notes (Signed)
Prenatal Visit Note Date: 06/20/2017 [facility]  Subjective:  Julie Ellis is a 36 y.o. [id number] at 5924w0d being seen today for ongoing prenatal care.  She is currently monitored for the following issues for this high-risk pregnancy and has Supervision of high risk pregnancy, antepartum, first trimester; Breast cyst, right; Chronic hypertension during pregnancy, antepartum; and AMA (advanced maternal age) multigravida 35+, second trimester on their problem list.  Patient reports no complaints.   Contractions: Not present. Vag. Bleeding: None.  Movement: Absent. Denies leaking of fluid.   The following portions of the patient's history were reviewed and updated as appropriate: allergies, current medications, past family history, past medical history, past social history, past surgical history and problem list. Problem list updated.  Objective:   Vitals:   06/20/17 1112  BP: 130/90  Pulse: 86  Weight: 150 lb (68 kg)    Fetal Status: Fetal Heart Rate (bpm): 152   Movement: Absent     General:  Alert, oriented and cooperative. Patient is in no acute distress.  Skin: Skin is warm and dry. No rash noted.   Cardiovascular: Normal heart rate noted  Respiratory: Normal respiratory effort, no problems with respiration noted  Abdomen: Soft, gravid, appropriate for gestational age. Pain/Pressure: Absent     Pelvic:  Cervical exam deferred        Extremities: Normal range of motion.  Edema: None  Mental Status: Normal mood and affect. Normal behavior. Normal judgment and thought content.   Urinalysis:      Assessment and Plan:  Pregnancy: [id number] at 9224w0d  1. Chronic hypertension during pregnancy, antepartum Pt confirms no problem on higher dose labetalol from last visit at 200 bid and also confirms low dose asa. Baseline pre-x labs today - Comprehensive metabolic panel - Protein / Creatinine Ratio, Urine - US MFM OB DETAIL +14 WK; Future  2.  Supervision of high risk pregnancy, antepartum, first trimester Routine care.  - Comprehensive metabolic panel - Protein / Creatinine Ratio, Urine - AFP TETRA - US MFM OB DETAIL +14 WK; Future  3. AMA (advanced maternal age) multigravida 35+, second trimester Pt amenable to afp today. 1st trimester screen negative.  F/u anatomy u/s  Preterm labor symptoms and general obstetric precautions including but not limited to vaginal bleeding, contractions, leaking of fluid and fetal movement were reviewed in detail with the patient. Please refer to After Visit Summary for other counseling recommendations.  Return in about 3 weeks (around 07/11/2017) for low risk ob.   Jean Lafitte BingPickens, Rendon Howell, MD

## 2017-06-20 NOTE — Progress Notes (Signed)
[employer] interpreter Jaclynn Guarnerisabella [phone]

## 2017-06-21 LAB — PROTEIN / CREATININE RATIO, URINE
Creatinine, Urine: 107.6 mg/dL
PROTEIN UR: 12.9 mg/dL
PROTEIN/CREAT RATIO: 120 mg/g{creat} (ref 0–200)

## 2017-06-23 LAB — COMPREHENSIVE METABOLIC PANEL
ALBUMIN: 3.9 g/dL (ref 3.5–5.5)
ALK PHOS: 59 IU/L (ref 39–117)
ALT: 11 IU/L (ref 0–32)
AST: 9 IU/L (ref 0–40)
Albumin/Globulin Ratio: 1.3 (ref 1.2–2.2)
BUN / CREAT RATIO: 12 (ref 9–23)
BUN: 7 mg/dL (ref 6–20)
Bilirubin Total: 0.3 mg/dL (ref 0.0–1.2)
CO2: 21 mmol/L (ref 20–29)
CREATININE: 0.59 mg/dL (ref 0.57–1.00)
Calcium: 9.1 mg/dL (ref 8.7–10.2)
Chloride: 102 mmol/L (ref 96–106)
GFR calc non Af Amer: 119 mL/min/{1.73_m2} (ref >59)
GFR, EST AFRICAN AMERICAN: 137 mL/min/{1.73_m2} (ref >59)
GLOBULIN, TOTAL: 3.1 g/dL (ref 1.5–4.5)
Glucose: 75 mg/dL (ref 65–99)
Potassium: 4.4 mmol/L (ref 3.5–5.2)
SODIUM: 136 mmol/L (ref 134–144)
TOTAL PROTEIN: 7 g/dL (ref 6.0–8.5)

## 2017-06-23 LAB — AFP, SERUM, OPEN SPINA BIFIDA
AFP MoM: 1.14
AFP Value: 29.2 ng/mL
GEST. AGE ON COLLECTION DATE: 15 wk
Maternal Age At EDD: 36.1 yr
OSBR RISK 1 IN: 7850
TEST RESULTS AFP: NEGATIVE
WEIGHT: 152 [lb_av]

## 2017-07-14 ENCOUNTER — Ambulatory Visit (INDEPENDENT_AMBULATORY_CARE_PROVIDER_SITE_OTHER): Payer: Self-pay | Admitting: Student

## 2017-07-14 VITALS — BP 148/97 | HR 101 | Wt 154.0 lb

## 2017-07-14 DIAGNOSIS — O09522 Supervision of elderly multigravida, second trimester: Secondary | ICD-10-CM

## 2017-07-14 DIAGNOSIS — O10919 Unspecified pre-existing hypertension complicating pregnancy, unspecified trimester: Secondary | ICD-10-CM

## 2017-07-14 DIAGNOSIS — O0991 Supervision of high risk pregnancy, unspecified, first trimester: Secondary | ICD-10-CM

## 2017-07-14 DIAGNOSIS — O10912 Unspecified pre-existing hypertension complicating pregnancy, second trimester: Secondary | ICD-10-CM

## 2017-07-14 DIAGNOSIS — O0992 Supervision of high risk pregnancy, unspecified, second trimester: Secondary | ICD-10-CM

## 2017-07-14 NOTE — Progress Notes (Signed)
   PRENATAL VISIT NOTE  Subjective:  Julie Ellis is a 37 y.o. [id number] at 4558w3d being seen today for ongoing prenatal care.  She is currently monitored for the following issues for this high-risk pregnancy and has Supervision of high risk pregnancy, antepartum, first trimester; Breast cyst, right; Chronic hypertension during pregnancy, antepartum; and AMA (advanced maternal age) multigravida 35+, second trimester on their problem list.  Patient reports no complaints. Denies HA, N/V, dysuria, blurry vision or visual changes, epigastric pain.   Contractions: Not present. Vag. Bleeding: None.  Movement: Present. Denies leaking of fluid.   The following portions of the patient's history were reviewed and updated as appropriate: allergies, current medications, past family history, past medical history, past social history, past surgical history and problem list. Problem list updated.  Objective:   Vitals:   07/14/17 1532  BP: (!) 148/97  Pulse: (!) 101  Weight: 154 lb (69.9 kg)    Fetal Status: Fetal Heart Rate (bpm): 148   Movement: Present     General:  Alert, oriented and cooperative. Patient is in no acute distress.  Skin: Skin is warm and dry. No rash noted.   Cardiovascular: Normal heart rate noted  Respiratory: Normal respiratory effort, no problems with respiration noted  Abdomen: Soft, gravid, appropriate for gestational age.  Pain/Pressure: Present     Pelvic: Cervical exam deferred        Extremities: Normal range of motion.  Edema: None  Mental Status:  Normal mood and affect. Normal behavior. Normal judgment and thought content.   Assessment and Plan:  Pregnancy: [id number] at 558w3d  1. AMA (advanced maternal age) multigravida 35+, second trimester   2. Chronic hypertension during pregnancy, antepartum BP stable today; patient did not take her meds. Reinforced the importance of taking her labetalol as ordered, as well as her aspirin. Explained pathophys of  pre-eclampsia and why we want to control her BP in pregnancy. Information given in AVS.   3. Supervision of high risk pregnancy, antepartum, first trimester -Anatomy scan scheduled for next week; will begin Growth US q 4 weeks [facility]   Preterm labor symptoms and general obstetric precautions including but not limited to vaginal bleeding, contractions, leaking of fluid and fetal movement were reviewed in detail with the patient. Please refer to After Visit Summary for other counseling recommendations.  Return in about 4 weeks (around 08/11/2017), or [facility].   [name], CNM

## 2017-07-14 NOTE — Patient Instructions (Signed)
Preeclampsia y eclampsia °(Preeclampsia and Eclampsia) °La preeclampsia es una afección grave que solo se manifiesta durante el embarazo. También se la conoce como toxemia del embarazo. Esta afección provoca el aumento de la presión arterial junto con otros síntomas, como hinchazón y dolores de cabeza. Estos síntomas pueden aparecer a medida que la afección empeora. La preeclampsia puede presentarse a las 20 semanas de gestación o posteriormente. °El diagnóstico y el tratamiento tempranos de esta afección son muy importantes. Si no se la trata de inmediato, puede causarles problemas graves a usted y al bebé. Una complicación que puede provocar es la eclampsia, una afección que causa temblores o contracciones musculares (convulsiones) en la madre. Provocar el parto del bebé es el mejor tratamiento para la preeclampsia o la eclampsia. La preeclampsia y la eclampsia por lo general, desaparecen después del nacimiento del bebé. °CAUSAS °La causa de la preeclampsia no se conoce. °FACTORES DE RIESGO °Los siguientes factores pueden hacer que usted sea propensa a tener preeclampsia: °· Estar embarazada por primera vez. °· Haber tenido preeclampsia durante un embarazo anterior. °· Tener antecedentes familiares de preeclampsia. °· Tener presión arterial alta. °· Estar embarazada de gemelos o trillizos. °· Tener 35 años o más. °· Ser de raza afroamericana. °· Tener enfermedad renal o diabetes. °· Tener enfermedades, como lupus o enfermedades de la sangre. °· Tener mucho sobrepeso (obesidad). °SÍNTOMAS °Los primeros signos de preeclampsia son: °· Hipertensión arterial. °· Aumento de las proteínas en la orina. El médico la controlará en cada visita prenatal. °Otros síntomas que pueden aparecer a medida que la afección empeora incluyen: °· Dolor de cabeza intenso. °· Aumento repentino de peso. °· Hinchazón de las manos, del rostro, de las piernas y de los pies. °· Náuseas y vómitos. °· Problemas visuales, como visión doble o  borrosa. °· Adormecimiento del rostro, de los brazos, de las piernas y de los pies. °· Orinar mucho menos que lo habitual. °· Mareos. °· Hablar arrastrando las palabras. °· Dolor abdominal, especialmente en la zona superior del abdomen. °· Presenta convulsiones. °Los síntomas generalmente comienzan a desaparecer después del parto. °DIAGNÓSTICO °No hay pruebas diagnósticas para la preeclampsia. El médico le hará preguntas sobre los síntomas y verificará la presencia de signos de preeclampsia durante las visitas prenatales. También pueden hacerle otros estudios que incluyen: °· Análisis de orina. °· Análisis de sangre. °· Control de la presión arterial. °· Control de la frecuencia cardíaca del bebé. °· Ecografía. °TRATAMIENTO °Usted junto con su médico podrán determinar el mejor tratamiento. El tratamiento puede incluir lo siguiente: °· Si tiene un riesgo más alto de tener preeclampsia, tal vez necesite exámenes prenatales con más frecuencia. °· Reposo en cama. °· Reducir la cantidad de sal (sodio) que consume. °· Medicamentos para bajar la presión arterial. °· Permanecer en el hospital si la afección es grave. Allí, el tratamiento estará centrado en el control de la presión arterial y la cantidad de líquidos en el cuerpo (retención de líquidos). °· Puede ser que necesite tomar medicamentos (sulfato de magnesio) para prevenir las convulsiones. El medicamento se puede administrar como una inyección o por vía intravenosa (IV). °· Si su afección empeora, es posible que deba dar a luz antes de tiempo. Pueden provocarle el trabajo de parto con medicamentos (inducirse) o hacerle una cesárea. °INSTRUCCIONES PARA EL CUIDADO EN EL HOGAR °Comida y bebida °· Beba suficiente líquido para mantener la orina clara o de color amarillo pálido. °· Mantenga una dieta saludable con bajo contenido de sodio. No agregue sal a las comidas. Verifique las   etiquetas de los alimentos para conocer cuánto sodio hay en una comida o una  bebida. °· Evite la cafeína. °Estilo de vida °· No consuma ningún producto que contenga nicotina o tabaco, como cigarrillos y cigarrillos electrónicos. Si necesita ayuda para dejar de fumar, consulte al médico. °· No consuma drogas ni alcohol. °· Evite las situaciones estresantes tanto como sea posible. Haga reposo y duerma bien. °Instrucciones generales °· Tome los medicamentos de venta libre y los recetados solamente como se lo haya indicado el médico. °· Acuéstese de lado mientras hace reposo. De este modo, se quita la presión del bebé. °· Eleve las piernas mientras esté sentada o acostada. Intente colocar algunas almohadas debajo de las pantorrillas. °· Haga ejercicios regularmente. Consulte al médico qué tipos de ejercicios son seguros para usted. °· Concurra a todas las visitas de control prenatales y de seguimiento como se lo haya indicado el médico. Esto es importante. °PREVENCIÓN °Para prevenir la preeclampsia o la eclampsia en otro embarazo: °· Solicite atención médica adecuada durante el embarazo. El médico puede prevenir la preeclampsia o diagnosticarla y tratarla de manera temprana. °· El médico puede indicarle una dosis baja de aspirina o de suplemento de calcio para el próximo embarazo. °· Es posible que le hagan estudios de la presión arterial y el funcionamiento de los riñones después del parto. °· Mantenga un peso saludable. Pídale al médico que la ayude a controlar su peso durante del embarazo. °· Trabaje con el médico para controlar otros problemas de salud a largo plazo (crónicos) que tenga, como diabetes o problemas renales. °SOLICITE ATENCIÓN MÉDICA SI: °· Aumenta de peso más de lo esperado. °· Tiene dolores de cabeza. °· Tiene náuseas o vómitos. °· Siente dolor abdominal. °· Se siente mareada o que va a desvanecerse. °SOLICITE ATENCIÓN MÉDICA DE INMEDIATO SI: °· Aparece una hinchazón repentina o tiene una zona muy hinchada en cualquier parte del cuerpo. Esto suele ocurrir en las  piernas. °· Aumenta más de 5 libras (2,3 kg) o más en una semana. °· Siente de forma intensa: °? Dolor abdominal. °? Dolores de cabeza. °? Mareos. °? Problemas de visión. °? Confusión. °? Náuseas o vómitos. °· Tiene una convulsión. °· Tiene dificultad para mover cualquier parte del cuerpo. °· Siente adormecimiento en alguna parte del cuerpo. °· Tiene dificultad para hablar. °· Tiene cualquier sangrado anormal. °· Se desmaya. °Esta información no tiene como fin reemplazar el consejo del médico. Asegúrese de hacerle al médico cualquier pregunta que tenga. °Document Released: 03/17/2005 Document Revised: 09/29/2015 Document Reviewed: 01/12/2016 °Elsevier Interactive Patient Education © 2018 [employer] ° °

## 2017-07-14 NOTE — Progress Notes (Signed)
Pt stated fell down 2 days ago and have bruise on the RLQ.

## 2017-07-18 ENCOUNTER — Other Ambulatory Visit (HOSPITAL_COMMUNITY): Payer: Self-pay | Admitting: *Deleted

## 2017-07-18 ENCOUNTER — Ambulatory Visit (HOSPITAL_COMMUNITY)
Admission: RE | Admit: 2017-07-18 | Discharge: 2017-07-18 | Disposition: A | Discharge status: Home / Self Care | Payer: Self-pay | Source: Ambulatory Visit | Attending: Obstetrics and Gynecology | Admitting: Obstetrics and Gynecology

## 2017-07-18 ENCOUNTER — Encounter (HOSPITAL_COMMUNITY): Payer: Self-pay

## 2017-07-18 DIAGNOSIS — O09522 Supervision of elderly multigravida, second trimester: Secondary | ICD-10-CM | POA: Insufficient documentation

## 2017-07-18 DIAGNOSIS — O99332 Smoking (tobacco) complicating pregnancy, second trimester: Secondary | ICD-10-CM | POA: Insufficient documentation

## 2017-07-18 DIAGNOSIS — O10012 Pre-existing essential hypertension complicating pregnancy, second trimester: Secondary | ICD-10-CM | POA: Insufficient documentation

## 2017-07-18 DIAGNOSIS — Z3A19 19 weeks gestation of pregnancy: Secondary | ICD-10-CM | POA: Insufficient documentation

## 2017-07-18 DIAGNOSIS — Z363 Encounter for antenatal screening for malformations: Secondary | ICD-10-CM | POA: Insufficient documentation

## 2017-07-18 DIAGNOSIS — O10919 Unspecified pre-existing hypertension complicating pregnancy, unspecified trimester: Secondary | ICD-10-CM

## 2017-07-18 DIAGNOSIS — O0991 Supervision of high risk pregnancy, unspecified, first trimester: Secondary | ICD-10-CM

## 2017-07-18 NOTE — ED Notes (Signed)
Pt reports leaking a small amt of clear fluid 3 days ago, no leaking since.

## 2017-08-11 ENCOUNTER — Ambulatory Visit (INDEPENDENT_AMBULATORY_CARE_PROVIDER_SITE_OTHER): Payer: Self-pay | Admitting: Obstetrics & Gynecology

## 2017-08-11 VITALS — BP 132/95 | HR 101 | Wt 157.6 lb

## 2017-08-11 DIAGNOSIS — O10919 Unspecified pre-existing hypertension complicating pregnancy, unspecified trimester: Secondary | ICD-10-CM

## 2017-08-11 DIAGNOSIS — Z23 Encounter for immunization: Secondary | ICD-10-CM

## 2017-08-11 DIAGNOSIS — O0991 Supervision of high risk pregnancy, unspecified, first trimester: Secondary | ICD-10-CM

## 2017-08-11 DIAGNOSIS — O0992 Supervision of high risk pregnancy, unspecified, second trimester: Secondary | ICD-10-CM

## 2017-08-11 DIAGNOSIS — R399 Unspecified symptoms and signs involving the genitourinary system: Secondary | ICD-10-CM

## 2017-08-11 DIAGNOSIS — O10912 Unspecified pre-existing hypertension complicating pregnancy, second trimester: Secondary | ICD-10-CM

## 2017-08-11 DIAGNOSIS — O09522 Supervision of elderly multigravida, second trimester: Secondary | ICD-10-CM

## 2017-08-11 LAB — POCT URINALYSIS DIP (DEVICE)
Bilirubin Urine: NEGATIVE
Glucose, UA: NEGATIVE mg/dL
Hgb urine dipstick: NEGATIVE
Leukocytes, UA: NEGATIVE
Nitrite: NEGATIVE
Protein, ur: 30 mg/dL — AB
Specific Gravity, Urine: >=1.03 (ref 1.005–1.030)
Urobilinogen, UA: 0.2 mg/dL (ref 0.0–1.0)
pH: 6 (ref 5.0–8.0)

## 2017-08-11 NOTE — Progress Notes (Signed)
Fell on 2/14, had small amt bleeding at that time, none since, baby is active. Pt feeling lower abd pressure, espacially at night with urgency to urinate but urinates only a little bit. Spanish interpreter Elna Breslowarol Hernandez present for visit    PRENATAL VISIT NOTE  Subjective:  Julie Ellis is a 37 y.o. [id number] at 6173w3d being seen today for ongoing prenatal care.  She is currently monitored for the following issues for this high-risk pregnancy and has Supervision of high risk pregnancy, antepartum, first trimester; Breast cyst, right; Chronic hypertension during pregnancy, antepartum; and AMA (advanced maternal age) multigravida 35+, second trimester on their problem list.  Patient reports lower abdominal cramping.  Contractions: Not present. Vag. Bleeding: None.  Movement: Present. Denies leaking of fluid.   The following portions of the patient's history were reviewed and updated as appropriate: allergies, current medications, past family history, past medical history, past social history, past surgical history and problem list. Problem list updated.  Objective:   BP at rest is nml (pt was crying during initial BP)  Fetal Status: Fetal Heart Rate (bpm): 150   Movement: Present     General:  Alert, oriented and cooperative. Patient is in no acute distress.  Skin: Skin is warm and dry. No rash noted.   Cardiovascular: Normal heart rate noted  Respiratory: Normal respiratory effort, no problems with respiration noted  Abdomen: Soft, gravid, appropriate for gestational age.  Pain/Pressure: Present     Pelvic: Cervical exam performed      closed/long/high  Extremities: Normal range of motion.  Edema: None  Mental Status:  Normal mood and affect. Normal behavior. Normal judgment and thought content.   Assessment and Plan:  Pregnancy: G5P3013 at 3573w3d  1. Supervision of high risk pregnancy, antepartum, first trimester Pt having lower abdominal cramping.  Sending UA and U cx.   Cervix is closed.  No bleeding.  2. Chronic hypertension during pregnancy, antepartum BP nml, on meds.  3. AMA (advanced maternal age) multigravida 35+, second trimester Quad negative  Preterm labor symptoms and general obstetric precautions including but not limited to vaginal bleeding, contractions, leaking of fluid and fetal movement were reviewed in detail with the patient. Please refer to After Visit Summary for other counseling recommendations.   3 weeks  Elsie LincolnKelly Nysir Fergusson, MD

## 2017-08-13 LAB — URINE CULTURE, OB REFLEX

## 2017-08-13 LAB — CULTURE, OB URINE

## 2017-08-15 ENCOUNTER — Encounter (HOSPITAL_COMMUNITY): Payer: Self-pay

## 2017-08-15 ENCOUNTER — Ambulatory Visit (HOSPITAL_COMMUNITY)
Admission: RE | Admit: 2017-08-15 | Discharge: 2017-08-15 | Disposition: A | Discharge status: Home / Self Care | Payer: Self-pay | Source: Ambulatory Visit | Attending: Obstetrics & Gynecology | Admitting: Obstetrics & Gynecology

## 2017-08-15 DIAGNOSIS — Z3A23 23 weeks gestation of pregnancy: Secondary | ICD-10-CM | POA: Insufficient documentation

## 2017-08-15 DIAGNOSIS — O99332 Smoking (tobacco) complicating pregnancy, second trimester: Secondary | ICD-10-CM | POA: Insufficient documentation

## 2017-08-15 DIAGNOSIS — O10012 Pre-existing essential hypertension complicating pregnancy, second trimester: Secondary | ICD-10-CM | POA: Insufficient documentation

## 2017-08-15 DIAGNOSIS — O09522 Supervision of elderly multigravida, second trimester: Secondary | ICD-10-CM | POA: Insufficient documentation

## 2017-08-15 DIAGNOSIS — Z362 Encounter for other antenatal screening follow-up: Secondary | ICD-10-CM | POA: Insufficient documentation

## 2017-08-15 DIAGNOSIS — O10919 Unspecified pre-existing hypertension complicating pregnancy, unspecified trimester: Secondary | ICD-10-CM

## 2017-08-29 ENCOUNTER — Ambulatory Visit (INDEPENDENT_AMBULATORY_CARE_PROVIDER_SITE_OTHER): Payer: Self-pay | Admitting: Family Medicine

## 2017-08-29 VITALS — BP 114/82 | HR 80 | Wt 161.6 lb

## 2017-08-29 DIAGNOSIS — O10919 Unspecified pre-existing hypertension complicating pregnancy, unspecified trimester: Secondary | ICD-10-CM

## 2017-08-29 DIAGNOSIS — Z23 Encounter for immunization: Secondary | ICD-10-CM

## 2017-08-29 DIAGNOSIS — O0992 Supervision of high risk pregnancy, unspecified, second trimester: Secondary | ICD-10-CM

## 2017-08-29 DIAGNOSIS — O09522 Supervision of elderly multigravida, second trimester: Secondary | ICD-10-CM

## 2017-08-29 DIAGNOSIS — O10912 Unspecified pre-existing hypertension complicating pregnancy, second trimester: Secondary | ICD-10-CM

## 2017-08-29 DIAGNOSIS — O0991 Supervision of high risk pregnancy, unspecified, first trimester: Secondary | ICD-10-CM

## 2017-08-29 NOTE — Progress Notes (Signed)
Pt states does not feel well ,feels it may be her BP, Her head has been hurting the last 2 days just not feeling like herself.Tdap given on 08/29/17 in the left arm @ 11:57.

## 2017-08-29 NOTE — Progress Notes (Signed)
   PRENATAL VISIT NOTE  Subjective:  Julie Ellis is a 37 y.o. [id number] at 3267w0d being seen today for ongoing prenatal care.  She is currently monitored for the following issues for this high-risk pregnancy and has Supervision of high risk pregnancy, antepartum, first trimester; Breast cyst, right; Chronic hypertension during pregnancy, antepartum; and AMA (advanced maternal age) multigravida 35+, second trimester on their problem list.  Patient reports patient feeling lightheaded and dizzy when standing. Feels a little off and had headache. When driving, had to stop car because she wasn't feeling well..  Contractions: Not present. Vag. Bleeding: None.  Movement: Present. Denies leaking of fluid.   The following portions of the patient's history were reviewed and updated as appropriate: allergies, current medications, past family history, past medical history, past social history, past surgical history and problem list. Problem list updated.  Objective:   Vitals:   08/29/17 1125  BP: 114/82  Pulse: 80  Weight: 161 lb 9.6 oz (73.3 kg)    Fetal Status: Fetal Heart Rate (bpm): 143 Fundal Height: 24 cm Movement: Present     General:  Alert, oriented and cooperative. Patient is in no acute distress.  Skin: Skin is warm and dry. No rash noted.   Cardiovascular: Normal heart rate noted  Respiratory: Normal respiratory effort, no problems with respiration noted  Abdomen: Soft, gravid, appropriate for gestational age.  Pain/Pressure: Present     Pelvic: Cervical exam deferred        Extremities: Normal range of motion.  Edema: None  Mental Status:  Normal mood and affect. Normal behavior. Normal judgment and thought content.   Assessment and Plan:  Pregnancy: G5P3013 at 6967w0d  1. Supervision of high risk pregnancy, antepartum, first trimester FHT and FH normal  2. Chronic hypertension during pregnancy, antepartum SBP 113 - will stop labetalol for now and have pt return in 4-5 days  for BP check with nurse. ? Of whether medications causing her symptoms Start antenatal testing at 32 weeks  3. AMA (advanced maternal age) multigravida 35+, second trimester No additional antenatal testing needed.  Preterm labor symptoms and general obstetric precautions including but not limited to vaginal bleeding, contractions, leaking of fluid and fetal movement were reviewed in detail with the patient. Please refer to After Visit Summary for other counseling recommendations.  No Follow-up on file.   Levie HeritageJacob J Susen Haskew, DO

## 2017-09-01 ENCOUNTER — Ambulatory Visit: Payer: Self-pay | Admitting: *Deleted

## 2017-09-01 VITALS — BP 128/92 | HR 101 | Wt 160.8 lb

## 2017-09-01 DIAGNOSIS — O10919 Unspecified pre-existing hypertension complicating pregnancy, unspecified trimester: Secondary | ICD-10-CM

## 2017-09-01 MED ORDER — LABETALOL HCL 100 MG PO TABS
100.0000 mg | ORAL_TABLET | Freq: Two times a day (BID) | ORAL | 1 refills | Status: DC
Start: 2017-09-01 — End: 2017-10-11

## 2017-09-01 MED ORDER — FOLIC ACID 800 MCG PO TABS: 800.0000 ug | ORAL_TABLET | Freq: Every day | ORAL | 5 refills | Status: DC

## 2017-09-01 NOTE — Progress Notes (Signed)
Agree with A & P. 

## 2017-09-01 NOTE — Progress Notes (Signed)
Patient presents to clinic for BP check. Patient states no more lightheadedness/dizzy spells since stopping labetalol. Today BP is high, pt asymptomatic. Reviewed case with Dr Alysia PennaErvin, who ordered restart labetalol at 100mg  bid. Return to see MD in 2 weeks. Info relayed to patient who voiced understanding. Spanish interpreter Okey RegalCarol used for visit.

## 2017-09-09 ENCOUNTER — Other Ambulatory Visit: Payer: Self-pay | Admitting: General Practice

## 2017-09-09 DIAGNOSIS — O10919 Unspecified pre-existing hypertension complicating pregnancy, unspecified trimester: Secondary | ICD-10-CM

## 2017-09-12 ENCOUNTER — Other Ambulatory Visit: Payer: Self-pay

## 2017-09-12 ENCOUNTER — Encounter (HOSPITAL_COMMUNITY): Payer: Self-pay

## 2017-09-12 ENCOUNTER — Other Ambulatory Visit (HOSPITAL_COMMUNITY): Payer: Self-pay | Admitting: Maternal and Fetal Medicine

## 2017-09-12 ENCOUNTER — Ambulatory Visit (HOSPITAL_COMMUNITY)
Admission: RE | Admit: 2017-09-12 | Discharge: 2017-09-12 | Disposition: A | Discharge status: Home / Self Care | Payer: Self-pay | Source: Ambulatory Visit | Attending: Obstetrics & Gynecology | Admitting: Obstetrics & Gynecology

## 2017-09-12 DIAGNOSIS — Z3A27 27 weeks gestation of pregnancy: Secondary | ICD-10-CM

## 2017-09-12 DIAGNOSIS — O10919 Unspecified pre-existing hypertension complicating pregnancy, unspecified trimester: Secondary | ICD-10-CM

## 2017-09-12 DIAGNOSIS — Z720 Tobacco use: Secondary | ICD-10-CM | POA: Insufficient documentation

## 2017-09-12 DIAGNOSIS — O09522 Supervision of elderly multigravida, second trimester: Secondary | ICD-10-CM

## 2017-09-12 DIAGNOSIS — O10012 Pre-existing essential hypertension complicating pregnancy, second trimester: Secondary | ICD-10-CM | POA: Insufficient documentation

## 2017-09-12 DIAGNOSIS — Z362 Encounter for other antenatal screening follow-up: Secondary | ICD-10-CM

## 2017-09-12 DIAGNOSIS — O99332 Smoking (tobacco) complicating pregnancy, second trimester: Secondary | ICD-10-CM | POA: Insufficient documentation

## 2017-09-13 ENCOUNTER — Other Ambulatory Visit (HOSPITAL_COMMUNITY): Payer: Self-pay | Admitting: *Deleted

## 2017-09-13 DIAGNOSIS — O10919 Unspecified pre-existing hypertension complicating pregnancy, unspecified trimester: Secondary | ICD-10-CM

## 2017-09-20 ENCOUNTER — Other Ambulatory Visit: Payer: Self-pay

## 2017-09-21 ENCOUNTER — Ambulatory Visit (INDEPENDENT_AMBULATORY_CARE_PROVIDER_SITE_OTHER): Payer: Self-pay | Admitting: Obstetrics and Gynecology

## 2017-09-21 VITALS — BP 127/92 | HR 101 | Wt 163.0 lb

## 2017-09-21 DIAGNOSIS — O10919 Unspecified pre-existing hypertension complicating pregnancy, unspecified trimester: Secondary | ICD-10-CM

## 2017-09-21 DIAGNOSIS — O0991 Supervision of high risk pregnancy, unspecified, first trimester: Secondary | ICD-10-CM

## 2017-09-21 DIAGNOSIS — Z789 Other specified health status: Secondary | ICD-10-CM

## 2017-09-21 LAB — POCT URINALYSIS DIP (DEVICE)
Bilirubin Urine: NEGATIVE
GLUCOSE, UA: NEGATIVE mg/dL
Nitrite: NEGATIVE
Protein, ur: 30 mg/dL — AB
Urobilinogen, UA: 0.2 mg/dL (ref 0.0–1.0)
pH: 5.5 (ref 5.0–8.0)

## 2017-09-21 NOTE — Progress Notes (Signed)
Prenatal Visit Note Date: 09/21/2017 [facility]  Subjective:  Julie Ellis is a 37 y.o. [id number] at 2267w2d being seen today for ongoing prenatal care.  She is currently monitored for the following issues for this high-risk pregnancy and has Supervision of high risk pregnancy, antepartum, first trimester; Breast cyst, right; Chronic hypertension during pregnancy, antepartum; and AMA (advanced maternal age) multigravida 35+, second trimester on their problem list.  Patient reports nosebleeds recently and ha, dizziness, feeling tired for the past 2-3wks.   Contractions: Not present. Vag. Bleeding: None.  Movement: Present. Denies leaking of fluid.   The following portions of the patient's history were reviewed and updated as appropriate: allergies, current medications, past family history, past medical history, past social history, past surgical history and problem list. Problem list updated.  Objective:   Vitals:   09/21/17 1614 09/21/17 1620  BP: (!) 132/98 (!) 127/92  Pulse: (!) 102 (!) 101  Weight: 163 lb (73.9 kg)     Fetal Status: Fetal Heart Rate (bpm): 149   Movement: Present     General:  Alert, oriented and cooperative. Patient is in no acute distress.  Skin: Skin is warm and dry. No rash noted.   Cardiovascular: Normal heart rate noted  Respiratory: Normal respiratory effort, no problems with respiration noted  Abdomen: Soft, gravid, appropriate for gestational age. Pain/Pressure: Present     Pelvic:  Cervical exam deferred        Extremities: Normal range of motion.  Edema: Trace  Mental Status: Normal mood and affect. Normal behavior. Normal judgment and thought content.   Urinalysis:      Assessment and Plan:  Pregnancy: G5P3013 at 2367w2d  1. Chronic hypertension during pregnancy, antepartum D/w pt that s/s sound like they are related to her labetalol which she started around that time. I told her that we can transition her to a  different medication but she'd like to stay with it for now. Pt told to let us know if she changes her mind.  2. Supervision of high risk pregnancy, antepartum, first trimester Pt to come back and 28wk labs for lab only visit.   Preterm labor symptoms and general obstetric precautions including but not limited to vaginal bleeding, contractions, leaking of fluid and fetal movement were reviewed in detail with the patient. Please refer to After Visit Summary for other counseling recommendations.  Return in about 2 weeks (around 10/05/2017) for hrob.   [place] BingPickens, Jessicca Stitzer, MD

## 2017-09-21 NOTE — Progress Notes (Signed)
Interpreter Erika  

## 2017-09-22 ENCOUNTER — Other Ambulatory Visit: Payer: Self-pay

## 2017-09-22 DIAGNOSIS — O10919 Unspecified pre-existing hypertension complicating pregnancy, unspecified trimester: Secondary | ICD-10-CM

## 2017-09-23 DIAGNOSIS — Z789 Other specified health status: Secondary | ICD-10-CM | POA: Insufficient documentation

## 2017-09-23 LAB — HIV ANTIBODY (ROUTINE TESTING W REFLEX): HIV SCREEN 4TH GENERATION: NONREACTIVE

## 2017-09-23 LAB — CBC
HEMATOCRIT: 34.7 % (ref 34.0–46.6)
HEMOGLOBIN: 11.6 g/dL (ref 11.1–15.9)
MCH: 31.2 pg (ref 26.6–33.0)
MCHC: 33.4 g/dL (ref 31.5–35.7)
MCV: 93 fL (ref 79–97)
Platelets: 282 10*3/uL (ref 150–379)
RBC: 3.72 x10E6/uL — ABNORMAL LOW (ref 3.77–5.28)
RDW: 13.8 % (ref 12.3–15.4)
WBC: 11.5 10*3/uL — ABNORMAL HIGH (ref 3.4–10.8)

## 2017-09-23 LAB — GLUCOSE TOLERANCE, 2 HOURS W/ 1HR
GLUCOSE, 1 HOUR: 147 mg/dL (ref 65–179)
Glucose, 2 hour: 117 mg/dL (ref 65–152)
Glucose, Fasting: 73 mg/dL (ref 65–91)

## 2017-09-23 LAB — RPR: RPR Ser Ql: NONREACTIVE

## 2017-10-10 ENCOUNTER — Encounter (HOSPITAL_COMMUNITY): Payer: Self-pay

## 2017-10-10 ENCOUNTER — Ambulatory Visit (HOSPITAL_COMMUNITY)
Admission: RE | Admit: 2017-10-10 | Discharge: 2017-10-10 | Disposition: A | Discharge status: Home / Self Care | Payer: Self-pay | Source: Ambulatory Visit | Attending: Obstetrics & Gynecology | Admitting: Obstetrics & Gynecology

## 2017-10-10 DIAGNOSIS — Z3A31 31 weeks gestation of pregnancy: Secondary | ICD-10-CM | POA: Insufficient documentation

## 2017-10-10 DIAGNOSIS — O10913 Unspecified pre-existing hypertension complicating pregnancy, third trimester: Secondary | ICD-10-CM | POA: Insufficient documentation

## 2017-10-10 DIAGNOSIS — O10919 Unspecified pre-existing hypertension complicating pregnancy, unspecified trimester: Secondary | ICD-10-CM

## 2017-10-11 ENCOUNTER — Ambulatory Visit (INDEPENDENT_AMBULATORY_CARE_PROVIDER_SITE_OTHER): Payer: Self-pay | Admitting: Obstetrics and Gynecology

## 2017-10-11 ENCOUNTER — Other Ambulatory Visit (HOSPITAL_COMMUNITY): Payer: Self-pay | Admitting: *Deleted

## 2017-10-11 VITALS — BP 137/99 | HR 101 | Wt 169.4 lb

## 2017-10-11 DIAGNOSIS — O10919 Unspecified pre-existing hypertension complicating pregnancy, unspecified trimester: Secondary | ICD-10-CM

## 2017-10-11 DIAGNOSIS — O09522 Supervision of elderly multigravida, second trimester: Secondary | ICD-10-CM

## 2017-10-11 DIAGNOSIS — O0991 Supervision of high risk pregnancy, unspecified, first trimester: Secondary | ICD-10-CM

## 2017-10-11 DIAGNOSIS — Z789 Other specified health status: Secondary | ICD-10-CM

## 2017-10-11 DIAGNOSIS — Z603 Acculturation difficulty: Secondary | ICD-10-CM

## 2017-10-11 MED ORDER — LABETALOL HCL 200 MG PO TABS: 200.0000 mg | ORAL_TABLET | Freq: Two times a day (BID) | ORAL | 1 refills | Status: DC

## 2017-10-11 NOTE — Progress Notes (Signed)
Subjective:  Julie Ellis is a 37 y.o. [id number] at 194w1d being seen today for ongoing prenatal care.  She is currently monitored for the following issues for this high-risk pregnancy and has Supervision of high risk pregnancy, antepartum, first trimester; Breast cyst, right; Chronic hypertension during pregnancy, antepartum; AMA (advanced maternal age) multigravida 35+, second trimester; and Language barrier on their problem list.  Patient reports no complaints.  Contractions: Not present. Vag. Bleeding: None.  Movement: Present. Denies leaking of fluid.   The following portions of the patient's history were reviewed and updated as appropriate: allergies, current medications, past family history, past medical history, past social history, past surgical history and problem list. Problem list updated.  Objective:   Vitals:   10/11/17 1620  BP: (!) 137/99  Pulse: (!) 101  Weight: 169 lb 6.4 oz (76.8 kg)    Fetal Status: Fetal Heart Rate (bpm): 151 Fundal Height: 31 cm Movement: Present     General:  Alert, oriented and cooperative. Patient is in no acute distress.  Skin: Skin is warm and dry. No rash noted.   Cardiovascular: Normal heart rate noted  Respiratory: Normal respiratory effort, no problems with respiration noted  Abdomen: Soft, gravid, appropriate for gestational age. Pain/Pressure: Absent     Pelvic: Vag. Bleeding: None     Cervical exam deferred        Extremities: Normal range of motion.  Edema: Trace  Mental Status: Normal mood and affect. Normal behavior. Normal judgment and thought content.   Urinalysis:      Assessment and Plan:  Pregnancy: [id number] at 634w1d  1. Supervision of high risk pregnancy, antepartum, first trimester Doing well.  2. Chronic hypertension during pregnancy, antepartum Elevated BP today. States her diastolic pressures are also elevated at home. Increase Labetalol to 200mg  BID. No PIH symptoms. Taking ASA. Start antenatal testing.  Follow-up US scheduled.  - labetalol (NORMODYNE) 200 MG tablet; Take 1 tablet (200 mg total) by mouth 2 (two) times daily.  Dispense: 30 tablet; Refill: 1  3. AMA (advanced maternal age) multigravida 35+, second trimester Low risk genetic testing. No additional antenatal testing needed.  4. Language barrier In-person Spanish interpretor used  Preterm labor symptoms and general obstetric precautions including but not limited to vaginal bleeding, contractions, leaking of fluid and fetal movement were reviewed in detail with the patient. Please refer to After Visit Summary for other counseling recommendations.  Return in about 2 weeks (around 10/25/2017) for ob visit.   Pincus LargePhelps, Juanda Luba Y, DO

## 2017-10-26 ENCOUNTER — Ambulatory Visit: Payer: Self-pay

## 2017-10-26 ENCOUNTER — Ambulatory Visit (INDEPENDENT_AMBULATORY_CARE_PROVIDER_SITE_OTHER): Payer: Self-pay | Admitting: Obstetrics and Gynecology

## 2017-10-26 ENCOUNTER — Encounter: Payer: Self-pay | Admitting: Obstetrics and Gynecology

## 2017-10-26 ENCOUNTER — Ambulatory Visit (INDEPENDENT_AMBULATORY_CARE_PROVIDER_SITE_OTHER): Payer: Self-pay | Admitting: *Deleted

## 2017-10-26 VITALS — BP 133/95 | HR 97 | Wt 169.3 lb

## 2017-10-26 DIAGNOSIS — O10919 Unspecified pre-existing hypertension complicating pregnancy, unspecified trimester: Secondary | ICD-10-CM

## 2017-10-26 DIAGNOSIS — Z789 Other specified health status: Secondary | ICD-10-CM

## 2017-10-26 DIAGNOSIS — O0991 Supervision of high risk pregnancy, unspecified, first trimester: Secondary | ICD-10-CM

## 2017-10-26 DIAGNOSIS — O09522 Supervision of elderly multigravida, second trimester: Secondary | ICD-10-CM

## 2017-10-26 DIAGNOSIS — O10913 Unspecified pre-existing hypertension complicating pregnancy, third trimester: Secondary | ICD-10-CM

## 2017-10-26 DIAGNOSIS — O0990 Supervision of high risk pregnancy, unspecified, unspecified trimester: Secondary | ICD-10-CM

## 2017-10-26 LAB — POCT URINALYSIS DIP (DEVICE)
Bilirubin Urine: NEGATIVE
Glucose, UA: 100 mg/dL — AB
Ketones, ur: 15 mg/dL — AB
NITRITE: NEGATIVE
PH: 6 (ref 5.0–8.0)
PROTEIN: 30 mg/dL — AB
Specific Gravity, Urine: >=1.03 (ref 1.005–1.030)
UROBILINOGEN UA: 0.2 mg/dL (ref 0.0–1.0)

## 2017-10-26 NOTE — Progress Notes (Signed)
   PRENATAL VISIT NOTE  Subjective:  Julie Ellis is a 37 y.o. [id number] at [redacted]w[redacted]d being seen today for ongoing prenatal care.  She is currently monitored for the following issues for this high-risk pregnancy and has Supervision of high risk pregnancy, antepartum, first trimester; Breast cyst, right; Chronic hypertension during pregnancy, antepartum; AMA (advanced maternal age) multigravida 35+, second trimester; and Language barrier on their problem list.  Patient reports no complaints.  Contractions: Not present. Vag. Bleeding: None.  Movement: Present. Denies leaking of fluid.   The following portions of the patient's history were reviewed and updated as appropriate: allergies, current medications, past family history, past medical history, past social history, past surgical history and problem list. Problem list updated.  Objective:   Vitals:   10/26/17 1007  BP: (!) 133/95  Pulse: 97  Weight: 169 lb 4.8 oz (76.8 kg)    Fetal Status: Fetal Heart Rate (bpm): NST   Movement: Present     General:  Alert, oriented and cooperative. Patient is in no acute distress.  Skin: Skin is warm and dry. No rash noted.   Cardiovascular: Normal heart rate noted  Respiratory: Normal respiratory effort, no problems with respiration noted  Abdomen: Soft, gravid, appropriate for gestational age.  Pain/Pressure: Present     Pelvic: Cervical exam deferred        Extremities: Normal range of motion.     Mental Status: Normal mood and affect. Normal behavior. Normal judgment and thought content.   Assessment and Plan:  Pregnancy: [id number] at [redacted]w[redacted]d  1. Supervision of high risk pregnancy, antepartum - [place] MFM FETAL BPP WO NON STRESS; Future  2. Chronic hypertension during pregnancy, antepartum Cont weekly testing NST today reactive BPP 10/10 today - [place] MFM FETAL BPP WO NON STRESS; Future Cont baby ASA cont labetalol 200 mg BID  3. AMA (advanced maternal age) multigravida 35+, second  trimester  4. Supervision of high risk pregnancy, antepartum, first trimester  5. Language barrier Engineer, structural used  Preterm labor symptoms and general obstetric precautions including but not limited to vaginal bleeding, contractions, leaking of fluid and fetal movement were reviewed in detail with the patient. Please refer to After Visit Summary for other counseling recommendations.  Return in about 1 week (around 11/02/2017) for NST/BPP only.  Future Appointments  Date Time Provider Department Center  11/02/2017 11:15 AM [facility] NST [facility]  11/08/2017  8:55 AM Pincus Large, DO [facility]  11/08/2017 10:00 AM [facility] [place] 3 [facility]    Conan Bowens, MD

## 2017-10-26 NOTE — Progress Notes (Signed)

## 2017-10-26 NOTE — Progress Notes (Signed)
Interpreter Julie Ellis present for encounter.  Pt denies H/A or visual disturbances. She reports frequent episodes of acid reflux - relieved with TUMS. [place] for growth scheduled on 5/21, BPP added.

## 2017-11-02 ENCOUNTER — Encounter (HOSPITAL_COMMUNITY): Payer: Self-pay

## 2017-11-02 ENCOUNTER — Inpatient Hospital Stay (HOSPITAL_COMMUNITY): Payer: Medicaid Other

## 2017-11-02 ENCOUNTER — Inpatient Hospital Stay (HOSPITAL_COMMUNITY): Payer: Medicaid Other | Admitting: Anesthesiology

## 2017-11-02 ENCOUNTER — Other Ambulatory Visit: Payer: Self-pay

## 2017-11-02 ENCOUNTER — Inpatient Hospital Stay (HOSPITAL_COMMUNITY)
Admission: AD | Admit: 2017-11-02 | Discharge: 2017-11-05 | DRG: 786 | Disposition: A | Discharge status: Home / Self Care | Payer: Medicaid Other | Source: Ambulatory Visit | Attending: Family Medicine | Admitting: Family Medicine

## 2017-11-02 ENCOUNTER — Encounter (HOSPITAL_COMMUNITY)
Admission: AD | Disposition: A | Discharge status: Home / Self Care | Payer: Self-pay | Source: Ambulatory Visit | Attending: Family Medicine

## 2017-11-02 DIAGNOSIS — O4593 Premature separation of placenta, unspecified, third trimester: Principal | ICD-10-CM | POA: Diagnosis present

## 2017-11-02 DIAGNOSIS — Z87891 Personal history of nicotine dependence: Secondary | ICD-10-CM

## 2017-11-02 DIAGNOSIS — D62 Acute posthemorrhagic anemia: Secondary | ICD-10-CM | POA: Diagnosis not present

## 2017-11-02 DIAGNOSIS — O10919 Unspecified pre-existing hypertension complicating pregnancy, unspecified trimester: Secondary | ICD-10-CM

## 2017-11-02 DIAGNOSIS — Z3A34 34 weeks gestation of pregnancy: Secondary | ICD-10-CM

## 2017-11-02 DIAGNOSIS — O1092 Unspecified pre-existing hypertension complicating childbirth: Secondary | ICD-10-CM | POA: Diagnosis not present

## 2017-11-02 DIAGNOSIS — O09523 Supervision of elderly multigravida, third trimester: Secondary | ICD-10-CM

## 2017-11-02 DIAGNOSIS — O1002 Pre-existing essential hypertension complicating childbirth: Secondary | ICD-10-CM | POA: Diagnosis present

## 2017-11-02 DIAGNOSIS — O9081 Anemia of the puerperium: Secondary | ICD-10-CM | POA: Diagnosis not present

## 2017-11-02 DIAGNOSIS — O368390 Maternal care for abnormalities of the fetal heart rate or rhythm, unspecified trimester, not applicable or unspecified: Secondary | ICD-10-CM

## 2017-11-02 DIAGNOSIS — O4590 Premature separation of placenta, unspecified, unspecified trimester: Secondary | ICD-10-CM | POA: Diagnosis present

## 2017-11-02 DIAGNOSIS — Z98891 History of uterine scar from previous surgery: Secondary | ICD-10-CM

## 2017-11-02 LAB — CBC
HEMATOCRIT: 35.1 % — AB (ref 36.0–46.0)
HEMATOCRIT: 25.8 % — AB (ref 36.0–46.0)
HEMOGLOBIN: 11.7 g/dL — AB (ref 12.0–15.0)
HEMOGLOBIN: 8.7 g/dL — AB (ref 12.0–15.0)
MCH: 31.1 pg (ref 26.0–34.0)
MCH: 31.3 pg (ref 26.0–34.0)
MCHC: 33.3 g/dL (ref 30.0–36.0)
MCHC: 33.7 g/dL (ref 30.0–36.0)
MCV: 93.4 fL (ref 78.0–100.0)
MCV: 92.8 fL (ref 78.0–100.0)
Platelets: 246 10*3/uL (ref 150–400)
Platelets: 191 10*3/uL (ref 150–400)
RBC: 3.76 MIL/uL — ABNORMAL LOW (ref 3.87–5.11)
RBC: 2.78 MIL/uL — ABNORMAL LOW (ref 3.87–5.11)
RDW: 14.2 % (ref 11.5–15.5)
RDW: 14.1 % (ref 11.5–15.5)
WBC: 16.1 10*3/uL — ABNORMAL HIGH (ref 4.0–10.5)
WBC: 20.8 10*3/uL — ABNORMAL HIGH (ref 4.0–10.5)

## 2017-11-02 LAB — HEMOGLOBIN AND HEMATOCRIT, BLOOD
HCT: 20.9 % — ABNORMAL LOW (ref 36.0–46.0)
Hemoglobin: 7.2 g/dL — ABNORMAL LOW (ref 12.0–15.0)

## 2017-11-02 LAB — RPR: RPR Ser Ql: NONREACTIVE

## 2017-11-02 LAB — PREPARE RBC (CROSSMATCH)

## 2017-11-02 SURGERY — Surgical Case
Anesthesia: General

## 2017-11-02 MED ORDER — ACETAMINOPHEN 325 MG PO TABS
650.0000 mg | ORAL_TABLET | Freq: Once | ORAL | Status: AC
  Administered 2017-11-02: 650 mg via ORAL
  Filled 2017-11-02: qty 2

## 2017-11-02 MED ORDER — OXYTOCIN 10 UNIT/ML IJ SOLN
INTRAMUSCULAR | Status: AC
  Filled 2017-11-02: qty 4

## 2017-11-02 MED ORDER — PRENATAL MULTIVITAMIN CH
1.0000 | ORAL_TABLET | Freq: Every day | ORAL | Status: DC
  Administered 2017-11-02 – 2017-11-05 (×4): 1 via ORAL
  Filled 2017-11-02 (×5): qty 1

## 2017-11-02 MED ORDER — SENNOSIDES-DOCUSATE SODIUM 8.6-50 MG PO TABS
2.0000 | ORAL_TABLET | ORAL | Status: DC
  Administered 2017-11-02 – 2017-11-05 (×3): 2 via ORAL
  Filled 2017-11-02 (×4): qty 2

## 2017-11-02 MED ORDER — FENTANYL CITRATE (PF) 100 MCG/2ML IJ SOLN
INTRAMUSCULAR | Status: AC
  Filled 2017-11-02: qty 2

## 2017-11-02 MED ORDER — SIMETHICONE 80 MG PO CHEW
80.0000 mg | CHEWABLE_TABLET | Freq: Three times a day (TID) | ORAL | Status: DC
  Administered 2017-11-02 – 2017-11-05 (×5): 80 mg via ORAL
  Filled 2017-11-02 (×6): qty 1

## 2017-11-02 MED ORDER — SIMETHICONE 80 MG PO CHEW
80.0000 mg | CHEWABLE_TABLET | ORAL | Status: DC
  Administered 2017-11-02 – 2017-11-03 (×2): 80 mg via ORAL
  Filled 2017-11-02 (×4): qty 1

## 2017-11-02 MED ORDER — SCOPOLAMINE 1 MG/3DAYS TD PT72
MEDICATED_PATCH | TRANSDERMAL | Status: AC
  Filled 2017-11-02: qty 1

## 2017-11-02 MED ORDER — SIMETHICONE 80 MG PO CHEW: 80.0000 mg | CHEWABLE_TABLET | ORAL | Status: DC | PRN

## 2017-11-02 MED ORDER — SODIUM CHLORIDE 0.9 % IR SOLN
Status: DC | PRN
  Administered 2017-11-02: 1

## 2017-11-02 MED ORDER — DEXAMETHASONE SODIUM PHOSPHATE 10 MG/ML IJ SOLN
INTRAMUSCULAR | Status: AC
  Filled 2017-11-02: qty 1

## 2017-11-02 MED ORDER — KETOROLAC TROMETHAMINE 30 MG/ML IJ SOLN
INTRAMUSCULAR | Status: AC
  Filled 2017-11-02: qty 1

## 2017-11-02 MED ORDER — NALOXONE HCL 4 MG/10ML IJ SOLN
1.0000 ug/kg/h | INTRAVENOUS | Status: DC | PRN
  Filled 2017-11-02: qty 5

## 2017-11-02 MED ORDER — SCOPOLAMINE 1 MG/3DAYS TD PT72
MEDICATED_PATCH | TRANSDERMAL | Status: DC | PRN
  Administered 2017-11-02: 1 via TRANSDERMAL

## 2017-11-02 MED ORDER — SODIUM CHLORIDE 0.9 % IV SOLN
Freq: Once | INTRAVENOUS | Status: AC
  Administered 2017-11-02: 21:00:00 via INTRAVENOUS

## 2017-11-02 MED ORDER — DIPHENHYDRAMINE HCL 25 MG PO CAPS
25.0000 mg | ORAL_CAPSULE | ORAL | Status: DC | PRN
  Filled 2017-11-02: qty 1

## 2017-11-02 MED ORDER — ENOXAPARIN SODIUM 40 MG/0.4ML ~~LOC~~ SOLN
40.0000 mg | SUBCUTANEOUS | Status: DC
  Administered 2017-11-03 – 2017-11-05 (×3): 40 mg via SUBCUTANEOUS
  Filled 2017-11-02 (×3): qty 0.4

## 2017-11-02 MED ORDER — ONDANSETRON HCL 4 MG/2ML IJ SOLN: 4.0000 mg | Freq: Three times a day (TID) | INTRAMUSCULAR | Status: DC | PRN

## 2017-11-02 MED ORDER — NALBUPHINE HCL 10 MG/ML IJ SOLN: 5.0000 mg | INTRAMUSCULAR | Status: DC | PRN

## 2017-11-02 MED ORDER — ONDANSETRON HCL 4 MG/2ML IJ SOLN
INTRAMUSCULAR | Status: DC | PRN
  Administered 2017-11-02: 4 mg via INTRAVENOUS

## 2017-11-02 MED ORDER — METHYLERGONOVINE MALEATE 0.2 MG/ML IJ SOLN
INTRAMUSCULAR | Status: AC
  Filled 2017-11-02: qty 1

## 2017-11-02 MED ORDER — MISOPROSTOL 200 MCG PO TABS
800.0000 ug | ORAL_TABLET | Freq: Once | ORAL | Status: AC
  Administered 2017-11-02: 800 ug via ORAL
  Filled 2017-11-02: qty 4

## 2017-11-02 MED ORDER — PHENYLEPHRINE 8 MG IN D5W 100 ML (0.08MG/ML) PREMIX OPTIME
INJECTION | INTRAVENOUS | Status: AC
  Filled 2017-11-02: qty 100

## 2017-11-02 MED ORDER — LACTATED RINGERS IV SOLN: INTRAVENOUS | Status: DC

## 2017-11-02 MED ORDER — ACETAMINOPHEN 325 MG PO TABS: 650.0000 mg | ORAL_TABLET | ORAL | Status: DC | PRN

## 2017-11-02 MED ORDER — LACTATED RINGERS IV SOLN
INTRAVENOUS | Status: DC
  Administered 2017-11-02 (×2): via INTRAVENOUS

## 2017-11-02 MED ORDER — MORPHINE SULFATE (PF) 0.5 MG/ML IJ SOLN
INTRAMUSCULAR | Status: DC | PRN
  Administered 2017-11-02: .2 mg via INTRATHECAL

## 2017-11-02 MED ORDER — ZOLPIDEM TARTRATE 5 MG PO TABS: 5.0000 mg | ORAL_TABLET | Freq: Every evening | ORAL | Status: DC | PRN

## 2017-11-02 MED ORDER — MORPHINE SULFATE (PF) 0.5 MG/ML IJ SOLN
INTRAMUSCULAR | Status: AC
  Filled 2017-11-02: qty 10

## 2017-11-02 MED ORDER — NALBUPHINE HCL 10 MG/ML IJ SOLN: 5.0000 mg | Freq: Once | INTRAMUSCULAR | Status: DC | PRN

## 2017-11-02 MED ORDER — FENTANYL CITRATE (PF) 100 MCG/2ML IJ SOLN
INTRAMUSCULAR | Status: DC | PRN
  Administered 2017-11-02: 10 ug via INTRATHECAL

## 2017-11-02 MED ORDER — SCOPOLAMINE 1 MG/3DAYS TD PT72: 1.0000 | MEDICATED_PATCH | Freq: Once | TRANSDERMAL | Status: DC

## 2017-11-02 MED ORDER — MISOPROSTOL 200 MCG PO TABS
ORAL_TABLET | ORAL | Status: AC
  Filled 2017-11-02: qty 1

## 2017-11-02 MED ORDER — NIFEDIPINE ER OSMOTIC RELEASE 30 MG PO TB24
30.0000 mg | ORAL_TABLET | Freq: Every day | ORAL | Status: DC
  Administered 2017-11-02 – 2017-11-05 (×4): 30 mg via ORAL
  Filled 2017-11-02 (×4): qty 1

## 2017-11-02 MED ORDER — DEXAMETHASONE SODIUM PHOSPHATE 10 MG/ML IJ SOLN
INTRAMUSCULAR | Status: DC | PRN
  Administered 2017-11-02: 10 mg via INTRAVENOUS

## 2017-11-02 MED ORDER — PHENYLEPHRINE 40 MCG/ML (10ML) SYRINGE FOR IV PUSH (FOR BLOOD PRESSURE SUPPORT)
PREFILLED_SYRINGE | INTRAVENOUS | Status: AC
  Filled 2017-11-02: qty 10

## 2017-11-02 MED ORDER — HYDROMORPHONE HCL 1 MG/ML IJ SOLN
0.2500 mg | INTRAMUSCULAR | Status: DC | PRN
  Administered 2017-11-02: 0.5 mg via INTRAVENOUS
  Administered 2017-11-02 (×2): 0.25 mg via INTRAVENOUS

## 2017-11-02 MED ORDER — TETANUS-DIPHTH-ACELL PERTUSSIS 5-2.5-18.5 LF-MCG/0.5 IM SUSP: 0.5000 mL | Freq: Once | INTRAMUSCULAR | Status: DC

## 2017-11-02 MED ORDER — PHENYLEPHRINE 40 MCG/ML (10ML) SYRINGE FOR IV PUSH (FOR BLOOD PRESSURE SUPPORT)
PREFILLED_SYRINGE | INTRAVENOUS | Status: DC | PRN
  Administered 2017-11-02: 80 ug via INTRAVENOUS

## 2017-11-02 MED ORDER — PHENYLEPHRINE 8 MG IN D5W 100 ML (0.08MG/ML) PREMIX OPTIME: INJECTION | INTRAVENOUS | Status: DC | PRN

## 2017-11-02 MED ORDER — FENTANYL CITRATE (PF) 100 MCG/2ML IJ SOLN
50.0000 ug | Freq: Once | INTRAMUSCULAR | Status: AC
  Administered 2017-11-02: 50 ug via INTRAVENOUS

## 2017-11-02 MED ORDER — OXYCODONE HCL 5 MG PO TABS
5.0000 mg | ORAL_TABLET | ORAL | Status: DC | PRN
  Administered 2017-11-02 – 2017-11-04 (×3): 5 mg via ORAL
  Filled 2017-11-02 (×3): qty 1

## 2017-11-02 MED ORDER — MEPERIDINE HCL 25 MG/ML IJ SOLN: 6.2500 mg | INTRAMUSCULAR | Status: DC | PRN

## 2017-11-02 MED ORDER — COCONUT OIL OIL: 1.0000 "application " | TOPICAL_OIL | Status: DC | PRN

## 2017-11-02 MED ORDER — PROMETHAZINE HCL 25 MG/ML IJ SOLN: 6.2500 mg | INTRAMUSCULAR | Status: DC | PRN

## 2017-11-02 MED ORDER — WITCH HAZEL-GLYCERIN EX PADS: 1.0000 "application " | MEDICATED_PAD | CUTANEOUS | Status: DC | PRN

## 2017-11-02 MED ORDER — DIPHENHYDRAMINE HCL 25 MG PO CAPS: 25.0000 mg | ORAL_CAPSULE | Freq: Four times a day (QID) | ORAL | Status: DC | PRN

## 2017-11-02 MED ORDER — LACTATED RINGERS IV SOLN
INTRAVENOUS | Status: DC | PRN
  Administered 2017-11-02 (×3): via INTRAVENOUS

## 2017-11-02 MED ORDER — ONDANSETRON HCL 4 MG/2ML IJ SOLN
INTRAMUSCULAR | Status: AC
  Filled 2017-11-02: qty 2

## 2017-11-02 MED ORDER — OXYTOCIN 40 UNITS IN LACTATED RINGERS INFUSION - SIMPLE MED: 2.5000 [IU]/h | INTRAVENOUS | Status: DC

## 2017-11-02 MED ORDER — OXYCODONE HCL 5 MG PO TABS
10.0000 mg | ORAL_TABLET | ORAL | Status: DC | PRN
  Administered 2017-11-05: 10 mg via ORAL
  Filled 2017-11-02 (×2): qty 2

## 2017-11-02 MED ORDER — KETOROLAC TROMETHAMINE 30 MG/ML IJ SOLN
30.0000 mg | Freq: Once | INTRAMUSCULAR | Status: AC | PRN
  Administered 2017-11-02: 30 mg via INTRAVENOUS

## 2017-11-02 MED ORDER — MENTHOL 3 MG MT LOZG: 1.0000 | LOZENGE | OROMUCOSAL | Status: DC | PRN

## 2017-11-02 MED ORDER — DIBUCAINE 1 % RE OINT: 1.0000 "application " | TOPICAL_OINTMENT | RECTAL | Status: DC | PRN

## 2017-11-02 MED ORDER — OXYTOCIN 10 UNIT/ML IJ SOLN
INTRAVENOUS | Status: DC | PRN
  Administered 2017-11-02: 40 [IU] via INTRAVENOUS

## 2017-11-02 MED ORDER — CARBOPROST TROMETHAMINE 250 MCG/ML IM SOLN
INTRAMUSCULAR | Status: AC
  Filled 2017-11-02: qty 1

## 2017-11-02 MED ORDER — CEFAZOLIN SODIUM-DEXTROSE 2-4 GM/100ML-% IV SOLN
2.0000 g | INTRAVENOUS | Status: AC
  Administered 2017-11-02 (×2): 2 g via INTRAVENOUS
  Filled 2017-11-02: qty 100

## 2017-11-02 MED ORDER — PHENYLEPHRINE HCL 10 MG/ML IJ SOLN
INTRAVENOUS | Status: DC | PRN
  Administered 2017-11-02: 60 ug/min via INTRAVENOUS

## 2017-11-02 MED ORDER — BUPIVACAINE IN DEXTROSE 0.75-8.25 % IT SOLN
INTRATHECAL | Status: DC | PRN
  Administered 2017-11-02: 1.6 mL via INTRATHECAL

## 2017-11-02 MED ORDER — MISOPROSTOL 200 MCG PO TABS
1000.0000 ug | ORAL_TABLET | Freq: Once | ORAL | Status: AC
  Administered 2017-11-02: 1000 ug via RECTAL

## 2017-11-02 MED ORDER — BETAMETHASONE SOD PHOS & ACET 6 (3-3) MG/ML IJ SUSP
12.0000 mg | Freq: Once | INTRAMUSCULAR | Status: AC
  Administered 2017-11-02: 12 mg via INTRAMUSCULAR
  Filled 2017-11-02: qty 2

## 2017-11-02 MED ORDER — SODIUM CHLORIDE 0.9% FLUSH: 3.0000 mL | INTRAVENOUS | Status: DC | PRN

## 2017-11-02 MED ORDER — IBUPROFEN 600 MG PO TABS
600.0000 mg | ORAL_TABLET | Freq: Four times a day (QID) | ORAL | Status: DC
  Administered 2017-11-02 – 2017-11-05 (×13): 600 mg via ORAL
  Filled 2017-11-02 (×15): qty 1

## 2017-11-02 MED ORDER — NALOXONE HCL 0.4 MG/ML IJ SOLN: 0.4000 mg | INTRAMUSCULAR | Status: DC | PRN

## 2017-11-02 MED ORDER — METHYLERGONOVINE MALEATE 0.2 MG/ML IJ SOLN
INTRAMUSCULAR | Status: DC | PRN
  Administered 2017-11-02: 0.2 mg via INTRAMUSCULAR

## 2017-11-02 MED ORDER — OXYCODONE HCL 5 MG/5ML PO SOLN: 5.0000 mg | Freq: Once | ORAL | Status: DC | PRN

## 2017-11-02 MED ORDER — SOD CITRATE-CITRIC ACID 500-334 MG/5ML PO SOLN
30.0000 mL | ORAL | Status: AC
  Administered 2017-11-02: 30 mL via ORAL
  Filled 2017-11-02: qty 15

## 2017-11-02 MED ORDER — DIPHENHYDRAMINE HCL 50 MG/ML IJ SOLN: 12.5000 mg | INTRAMUSCULAR | Status: DC | PRN

## 2017-11-02 MED ORDER — OXYCODONE HCL 5 MG PO TABS: 5.0000 mg | ORAL_TABLET | Freq: Once | ORAL | Status: DC | PRN

## 2017-11-02 MED ORDER — HYDROMORPHONE HCL 1 MG/ML IJ SOLN
INTRAMUSCULAR | Status: AC
  Filled 2017-11-02: qty 1

## 2017-11-02 MED ORDER — CARBOPROST TROMETHAMINE 250 MCG/ML IM SOLN
INTRAMUSCULAR | Status: DC | PRN
  Administered 2017-11-02: 250 ug via INTRAMUSCULAR

## 2017-11-02 SURGICAL SUPPLY — 33 items
APL SKNCLS STERI-STRIP NONHPOA (GAUZE/BANDAGES/DRESSINGS) ×1
BENZOIN TINCTURE PRP APPL 2/3 (GAUZE/BANDAGES/DRESSINGS) ×3 IMPLANT
CHLORAPREP W/TINT 26ML (MISCELLANEOUS) ×3 IMPLANT
CLAMP CORD UMBIL (MISCELLANEOUS) IMPLANT
CLOSURE WOUND 1/2 X4 (GAUZE/BANDAGES/DRESSINGS) ×1
CLOTH BEACON ORANGE TIMEOUT ST (SAFETY) ×3 IMPLANT
DRSG OPSITE POSTOP 4X10 (GAUZE/BANDAGES/DRESSINGS) ×3 IMPLANT
ELECT REM PT RETURN 9FT ADLT (ELECTROSURGICAL) ×3
ELECTRODE REM PT RTRN 9FT ADLT (ELECTROSURGICAL) ×1 IMPLANT
EXTRACTOR VACUUM M CUP 4 TUBE (SUCTIONS) IMPLANT
EXTRACTOR VACUUM M CUP 4' TUBE (SUCTIONS)
GLOVE BIOGEL PI IND STRL 7.0 (GLOVE) ×2 IMPLANT
GLOVE BIOGEL PI IND STRL 7.5 (GLOVE) ×2 IMPLANT
GLOVE BIOGEL PI INDICATOR 7.0 (GLOVE) ×4
GLOVE BIOGEL PI INDICATOR 7.5 (GLOVE) ×4
GLOVE ECLIPSE 7.5 STRL STRAW (GLOVE) ×3 IMPLANT
GOWN STRL REUS W/TWL LRG LVL3 (GOWN DISPOSABLE) ×9 IMPLANT
KIT ABG SYR 3ML LUER SLIP (SYRINGE) IMPLANT
NDL HYPO 25X5/8 SAFETYGLIDE (NEEDLE) IMPLANT
NEEDLE HYPO 25X5/8 SAFETYGLIDE (NEEDLE) IMPLANT
NS IRRIG 1000ML POUR BTL (IV SOLUTION) ×3 IMPLANT
PACK C SECTION WH (CUSTOM PROCEDURE TRAY) ×3 IMPLANT
PAD OB MATERNITY 4.3X12.25 (PERSONAL CARE ITEMS) ×3 IMPLANT
PENCIL SMOKE EVAC W/HOLSTER (ELECTROSURGICAL) ×3 IMPLANT
RTRCTR C-SECT PINK 25CM LRG (MISCELLANEOUS) ×3 IMPLANT
STRIP CLOSURE SKIN 1/2X4 (GAUZE/BANDAGES/DRESSINGS) ×2 IMPLANT
SUT VIC AB 0 CTX 36 (SUTURE) ×9
SUT VIC AB 0 CTX36XBRD ANBCTRL (SUTURE) ×3 IMPLANT
SUT VIC AB 2-0 CT1 27 (SUTURE) ×3
SUT VIC AB 2-0 CT1 TAPERPNT 27 (SUTURE) ×1 IMPLANT
SUT VIC AB 4-0 KS 27 (SUTURE) ×3 IMPLANT
TOWEL OR 17X24 6PK STRL BLUE (TOWEL DISPOSABLE) ×3 IMPLANT
TRAY FOLEY W/BAG SLVR 14FR LF (SET/KITS/TRAYS/PACK) ×3 IMPLANT

## 2017-11-02 NOTE — MAU Note (Signed)
Pt reports contractions that started around 930p tonight, but started getting worse on the way here. Pt denies vaginal bleeding or LOF. Reports feeling decreased fetal movement all through out the day today.
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
## 2017-11-02 NOTE — Anesthesia Postprocedure Evaluation (Signed)
Anesthesia Post Note  Patient: Julie Ellis  Procedure(s) Performed: CESAREAN SECTION (N/A )     Patient location during evaluation: Mother Baby Anesthesia Type: General Level of consciousness: awake and alert and oriented Pain management: satisfactory to patient Vital Signs Assessment: post-procedure vital signs reviewed and stable Respiratory status: spontaneous breathing and nonlabored ventilation Cardiovascular status: stable Postop Assessment: no headache, no backache, patient able to bend at knees, no signs of nausea or vomiting, adequate PO intake and spinal receding Anesthetic complications: no    Last Vitals:  Vitals:   11/02/17 1106 11/02/17 1206  BP: 121/90 (!) 140/98  Pulse: 89 75  Resp: 18 18  Temp: 36.8 C 36.9 C  SpO2: 99% 97%    Last Pain:  Vitals:   11/02/17 1206  TempSrc: Oral  PainSc:    Pain Goal: Patients Stated Pain Goal: 0 (11/02/17 0800)               Madison Hickman

## 2017-11-02 NOTE — Lactation Note (Signed)
This note was copied from a baby's chart. Lactation Consultation Note  Patient Name: Julie Ellis Date: 11/02/2017 Reason for consult: Initial assessment;Late-preterm 34-36.6wks;Infant < 6lbs;NICU baby  89 hours old late pre-term female; she's a NICU baby. Mom is a P4 and experienced BF, she was able to BF her other children for 1-2 months for the youngest ones and the oldest one for 18 months. Mom was in the middle of a blood transfusion when entering the room, she was in pain and not feeling well. Asked mom how was her feeding plan for baby since she came as breast and bottle, and she voiced she still wants to do both, but she'd be willing to start pumping tomorrow morning, not tonight because she's too tired.  Mom is a [place] speaker and she had several requests. She kept asking for her pain meds, her Foley and also a car seat for her baby, she doesn't have one at home. Let RN know about patient's concerns and RN will take care of those issues and make the necessary referrals. RN is also aware that mom needs to be set up with a DEBP tomorrow. Mom doesn't have a pump at home, but she got WIC at [facility] during this pregnancy. Explained to her about the pump loans with the [facility] program to let her RN know when [facility] staff comes to the hospital, she'll need to be set up with a Symphony breast pump from [facility] prior discharge. She's also aware of the [facility] loaner program from [facility].  Encouraged mom to start pumping tomorrow every 3 hours and at least once at night. Providing breastmilk for your baby in the NICU (SP), BF brochure (SP) and BF resources were reviewed. Mom is aware of LC services and will call PRN.  Maternal Data Formula Feeding for Exclusion: Yes Reason for exclusion: Admission to Intensive Care Unit (ICU) post-partum Has patient been taught Hand Expression?: Yes Does the patient have breastfeeding experience prior to this delivery?: Yes  Feeding Feeding Type: Donor  Breast Milk  Interventions Interventions: Breast feeding basics reviewed  Lactation Tools Discussed/Used WIC Program: Yes   Consult Status Consult Status: Follow-up Date: 11/03/17 Follow-up type: In-patient    Kees Idrovo Venetia Constable 11/02/2017, 10:59 PM

## 2017-11-02 NOTE — Progress Notes (Signed)
Call from the lab to pick up blood, but patient requested to go see her baby in NICU first.

## 2017-11-02 NOTE — Transfer of Care (Signed)
Immediate Anesthesia Transfer of Care Note  Patient: Julie Ellis  Procedure(s) Performed: CESAREAN SECTION (N/A )  Patient Location: PACU  Anesthesia Type:Spinal  Level of Consciousness: awake, alert , oriented and patient cooperative  Airway & Oxygen Therapy: Patient Spontanous Breathing  Post-op Assessment: Report given to RN and Post -op Vital signs reviewed and stable  Post vital signs: Reviewed and stable  Last Vitals:  Vitals Value Taken Time  BP    Temp    Pulse    Resp    SpO2      Last Pain:  Vitals:   11/02/17 0347  TempSrc:   PainSc: 10-Worst pain ever         Complications: No apparent anesthesia complications

## 2017-11-02 NOTE — Anesthesia Procedure Notes (Signed)
Spinal  Patient location during procedure: OB Start time: 11/02/2017 4:54 AM End time: 11/02/2017 4:59 AM Staffing Anesthesiologist: Lowella Curb, MD Performed: anesthesiologist  Preanesthetic Checklist Completed: patient identified, surgical consent, pre-op evaluation, timeout performed, IV checked, risks and benefits discussed and monitors and equipment checked Spinal Block Patient position: sitting Prep: site prepped and draped and DuraPrep Patient monitoring: heart rate, cardiac monitor, continuous pulse ox and blood pressure Approach: midline Location: L3-4 Injection technique: single-shot Needle Needle type: Pencan  Needle gauge: 24 G Needle length: 10 cm Assessment Sensory level: T4

## 2017-11-02 NOTE — MAU Note (Signed)
PT SAYS SHE CAME  TO HOSPITAL  FOR  UC.-  REGISTRATION    CAME -   PT IN LOBBY  PASSED OUT ON FLOOR - WOKE   WITH AMNONIA

## 2017-11-02 NOTE — Anesthesia Preprocedure Evaluation (Signed)
Anesthesia Evaluation    Airway Mallampati: II  TM Distance: >3 FB Neck ROM: Full    Dental no notable dental hx.    Pulmonary former smoker,    Pulmonary exam normal breath sounds clear to auscultation       Cardiovascular hypertension, Normal cardiovascular exam Rhythm:Regular Rate:Normal     Neuro/Psych    GI/Hepatic   Endo/Other    Renal/GU      Musculoskeletal   Abdominal   Peds  Hematology   Anesthesia Other Findings Placental abruption  Reproductive/Obstetrics (+) Pregnancy                             Anesthesia Physical Anesthesia Plan  ASA: III and emergent  Anesthesia Plan: General   Post-op Pain Management:    Induction: Intravenous  PONV Risk Score and Plan: 3 and Treatment may vary due to age or medical condition  Airway Management Planned: Oral ETT  Additional Equipment:   Intra-op Plan:   Post-operative Plan: Extubation in OR  Informed Consent: I have reviewed the patients History and Physical, chart, labs and discussed the procedure including the risks, benefits and alternatives for the proposed anesthesia with the patient or authorized representative who has indicated his/her understanding and acceptance.   Dental advisory given  Plan Discussed with: CRNA  Anesthesia Plan Comments:         Anesthesia Quick Evaluation

## 2017-11-02 NOTE — Progress Notes (Signed)
Patient ID: Julie Ellis, female   DOB: 10-06-1980, 37 y.o.   MRN: [id number] DOS LTCS d/t placental abruption  Pt without complaints. Tolerating diet. Pain controlled. Denies feeling weak or light headed  PE AF BP120-140's/90-100's Good UOP Lungs clear Heart RRR Abd soft + BS drsg intact U-2 firm GU foley scant blood on pad (changed an hour ago)  H/H 11.7/35.1 to 8.7/25.8  A/P Stable Nurse has noted about 350 cc blood lost on last 2 pad changes. Pt did have some uterine atony in OR, received uterine tonic medications H & H stable. Will give single dose or oral Cytotec and repeat H & H at 1800 Discussed possible transfusion for S/Sx and if next Hgb < 6. Pt is amendable to blood transfusion if need. Continue with supportive care

## 2017-11-02 NOTE — Progress Notes (Signed)
[facility] Report: Pt has HB 7.2 but reports symptomatic earlier this pm by nurse who cared for pt all day. Will transfuse 2 units PRBC.

## 2017-11-02 NOTE — Op Note (Signed)
Cesarean Section Operative Report  PATIENT: Julie Ellis  PROCEDURE DATE: 11/02/2017  PREOPERATIVE DIAGNOSES: Intrauterine pregnancy at [redacted]w[redacted]d weeks gestation; Placenta abruption  POSTOPERATIVE DIAGNOSES: The same  PROCEDURE: Primary Low Transverse Cesarean Section  SURGEON:   Surgeon(s) and Role:    * Levie Heritage, DO - Primary    * Degele, Kandra Nicolas, MD - Assisting - OB Fellow   INDICATIONS: Julie Ellis is a 37 y.o. [id number] at [redacted]w[redacted]d here for cesarean section secondary to the indications listed under preoperative diagnoses; please see preoperative note for further details.  The risks of cesarean section were discussed with the patient including but were not limited to: bleeding which may require transfusion or reoperation; infection which may require antibiotics; injury to bowel, bladder, ureters or other surrounding organs; injury to the fetus; need for additional procedures including hysterectomy in the event of a life-threatening hemorrhage; placental abnormalities wth subsequent pregnancies, incisional problems, thromboembolic phenomenon and other postoperative/anesthesia complications.   The patient concurred with the proposed plan, giving informed written consent for the procedure.    FINDINGS:  Viable female infant in cephalic presentation.  Apgars 2 and 7.  Clear amniotic fluid.  Intact placenta, three vessel cord.  Normal uterus, fallopian tubes and ovaries bilaterally.  ANESTHESIA: Spinal INTRAVENOUS FLUIDS: 1000 mL ESTIMATED BLOOD LOSS: 1222 mL URINE OUTPUT:  200 ml SPECIMENS: Placenta sent to pathology COMPLICATIONS: postpartum hemorrhage, secondary to placental abruption and uterine atony, managed with Pitocin, methergine, Hemabate, and misoprostol given intraoperatively   PROCEDURE IN DETAIL:  The patient preoperatively received intravenous antibiotics and had sequential compression devices applied to her lower extremities.  She was then taken to the  operating room where spinal anesthesia was administered and was found to be adequate. She was then placed in a dorsal supine position with a leftward tilt, and prepped and draped in a sterile manner.  A foley catheter was placed into her bladder and attached to constant gravity.    After an adequate timeout was performed, a Pfannenstiel skin incision was made with scalpel and carried through to the underlying layer of fascia. The fascia was incised in the midline, and this incision was extended bilaterally using the Mayo scissors.  Kocher clamps were applied to the superior aspect of the fascial incision and the underlying rectus muscles were dissected off bluntly.  A similar process was carried out on the inferior aspect of the fascial incision. The rectus muscles were separated in the midline bluntly and the peritoneum was entered bluntly. Attention was turned to the lower uterine segment where a low transverse hysterotomy was made with a scalpel and extended bilaterally bluntly.  The infant was successfully delivered, the cord was clamped and cut immediately without delay, and the infant was handed over to the awaiting neonatology team. Uterine massage was then administered, and the placenta delivered intact with a three-vessel cord. The uterus was then cleared of clots and debris.  The hysterotomy was closed with 0 Vicryl in a running locked fashion. Uterine tone remained poor despite IV Pitocin, and patient was given IM hemabate, IM methergine and rectal misoprostol, with some improvement noted. An imbricating layer was also placed with 0 Vicryl. The pelvis was cleared of all clot and debris. Hemostasis was confirmed on all surfaces.  The peritoneum was closed with a 0 Vicryl running stitch. The fascia was then closed using 0 Vicryl in a running fashion.  The subcutaneous layer was irrigated, and the skin was closed with a 4-0 Vicryl subcuticular stitch.  The patient tolerated the procedure well. Sponge,  lap, instrument and needle counts were correct x 3.  She was taken to the recovery room in stable condition.   Maternal Disposition: PACU - hemodynamically stable.   Infant Disposition: NICU  An experienced assistant was required given the standard of surgical care given the complexity of the case.  This assistant was needed for exposure, dissection, suctioning, retraction, instrument exchange, assisting with delivery with administration of fundal pressure, and for overall help during the procedure.  Signed: Frederik Pear, MD OB Fellow 11/02/2017 6:10 AM

## 2017-11-02 NOTE — Progress Notes (Signed)
Patient Name: Julie Ellis, female   DOB: [birth year], 37 y.o.  MRN: [id number]  US shows large placental abruption: 12cm x 9cm x 9cm. Still having a lot of pain. Will move towards cesarean delivery - pt in agreement.   The risks of cesarean section discussed with the patient included but were not limited to: bleeding which may require transfusion or reoperation; infection which may require antibiotics; injury to bowel, bladder, ureters or other surrounding organs; injury to the fetus; need for additional procedures including hysterectomy in the event of a life-threatening hemorrhage; placental abnormalities wth subsequent pregnancies, incisional problems, thromboembolic phenomenon and other postoperative/anesthesia complications. The patient concurred with the proposed plan, giving informed written consent for the procedure.   Patient has been NPO since last night, she will remain NPO for procedure. Anesthesia and OR aware.  Preoperative prophylactic Ancef ordered on call to the OR.  To OR when ready.  Levie Heritage, DO 11/02/2017 4:36 AM

## 2017-11-02 NOTE — H&P (Signed)
Julie Ellis is a 37 y.o. female presenting for uterine contractions since earlier this evening. Upon registering, she passed out at desk.  States she also passed out at home.  Upon admission, FHR noted to be in the 70s.  Usual intrauterine resuscitation measures were performed.  Two IVs were started and fluid bolused.  As BPs went up, so did FHR.  Dr Adrian Blackwater was called and arrived at bedside.   States is followed in clinic and that pregnancy has been.remarkable for AMA and chronic hypertension.  Denies bleeding or leaking.    RN Note: Pt reports contractions that started around 930p tonight, but started getting worse on the way here. Pt denies vaginal bleeding or LOF. Reports feeling decreased fetal movement all through out the day today.    OB History    Gravida  5   Para  3   Term  3   Preterm      AB  1   Living  3     SAB  1   TAB      Ectopic      Multiple      Live Births  3          Past Medical History:  Diagnosis Date  . Breast cyst, right 05/23/2017  . Hypertension    took herself off meds 1 1/2years ago   Past Surgical History:  Procedure Laterality Date  . DILATION AND CURETTAGE OF UTERUS  02/2016   Family History: family history includes Epilepsy in her mother. Social History:  reports that she has quit smoking. Her smoking use included cigarettes. She smoked 0.50 packs per day. She has never used smokeless tobacco. She reports that she does not drink alcohol or use drugs.     Maternal Diabetes: No Genetic Screening: Normal Maternal Ultrasounds/Referrals: Normal Fetal Ultrasounds or other Referrals:  None Maternal Substance Abuse:  No Significant Maternal Medications:  None Significant Maternal Lab Results:  None Other Comments:  Large Placental Abruption, had prolonged bradycardia on admission  Review of Systems  Constitutional: Negative for chills and fever.  Respiratory: Negative for shortness of breath.   Gastrointestinal:  Positive for abdominal pain.  Neurological: Positive for dizziness, loss of consciousness and weakness.   Maternal Medical History:  Reason for admission: Contractions.   Contractions: Onset was 1-2 hours ago.   Frequency: irregular.   Perceived severity is strong.    Fetal activity: Perceived fetal activity is normal.   Last perceived fetal movement was within the past hour.    Prenatal complications: PIH.   No bleeding or pre-eclampsia.   Prenatal Complications - Diabetes: none.    Dilation: 1 Blood pressure (!) 141/70, pulse (!) 143, temperature 97.8 F (36.6 C), temperature source Oral, resp. rate 20, last menstrual period 03/07/2017, SpO2 100 %. Maternal Exam:  Uterine Assessment: Contraction strength is moderate.  Contraction frequency is irregular.   Abdomen: Patient reports generalized tenderness.  Fetal presentation: vertex  Introitus: Normal vulva. Normal vagina.  Ferning test: not done.  Nitrazine test: not done.  Cervix: Cervix evaluated by digital exam.     Fetal Exam Fetal Monitor Review: Mode: ultrasound.   Baseline rate: 90s.  Variability: moderate (6-25 bpm).   Pattern: prolonged decelerations.    Fetal State Assessment: Category II - tracings are indeterminate. Initially nonreassuring but resolved to Category 2  Physical Exam  Constitutional: She appears well-developed and well-nourished. She appears distressed (upset and in pain).  HENT:  Head: Normocephalic.  Cardiovascular: Regular rhythm.  Respiratory: Effort normal. No respiratory distress.  GI: Soft. There is generalized tenderness. There is guarding. There is no rebound.  Genitourinary: Vagina normal.  Genitourinary Comments: Dilation: 1/70/-2   Musculoskeletal: Normal range of motion.  Neurological: She is alert.  Skin: Skin is warm and dry.  Psychiatric: She has a normal mood and affect.    Prenatal labs: ABO, Rh: O/Positive/-- (12/03 1051) Antibody: Negative (12/03  1051) Rubella: 1.42 (12/03 1051) RPR: Non Reactive (04/04 1020)  HBsAg: Negative (12/03 1051)  HIV: Non Reactive (04/04 1020)  GBS:     Assessment/Plan: Single intrauterine pregnancy at [redacted]w[redacted]d Uterine contractions Large placental abruption   Admit to [employer] Routine orders  IV x 2 lines Betamethasone MD to follow   Wynelle Bourgeois 11/02/2017, 4:07 AM

## 2017-11-03 ENCOUNTER — Encounter (HOSPITAL_COMMUNITY): Payer: Self-pay | Admitting: Family Medicine

## 2017-11-03 LAB — CBC
HEMATOCRIT: 24.4 % — AB (ref 36.0–46.0)
HEMOGLOBIN: 8.3 g/dL — AB (ref 12.0–15.0)
MCH: 31.4 pg (ref 26.0–34.0)
MCHC: 34 g/dL (ref 30.0–36.0)
MCV: 92.4 fL (ref 78.0–100.0)
Platelets: 166 10*3/uL (ref 150–400)
RBC: 2.64 MIL/uL — ABNORMAL LOW (ref 3.87–5.11)
RDW: 14.3 % (ref 11.5–15.5)
WBC: 21.4 10*3/uL — AB (ref 4.0–10.5)

## 2017-11-03 LAB — CREATININE, SERUM
CREATININE: 0.65 mg/dL (ref 0.44–1.00)
GFR calc Af Amer: >60 mL/min (ref >60)
GFR calc non Af Amer: >60 mL/min (ref >60)

## 2017-11-03 MED ORDER — FERROUS SULFATE 325 (65 FE) MG PO TABS
325.0000 mg | ORAL_TABLET | Freq: Two times a day (BID) | ORAL | Status: DC
Start: 2017-11-03 — End: 2017-11-05
  Administered 2017-11-03 – 2017-11-05 (×4): 325 mg via ORAL
  Filled 2017-11-03 (×4): qty 1

## 2017-11-03 NOTE — Anesthesia Postprocedure Evaluation (Signed)
Anesthesia Post Note  Patient: Julie Ellis  Procedure(s) Performed: CESAREAN SECTION (N/A )     Anesthesia Type: General    Last Vitals:  Vitals:   11/03/17 0055 11/03/17 0325  BP: 101/65 (!) 106/59  Pulse: 71 85  Resp: 18 16  Temp: 36.9 C 36.8 C  SpO2: 96% 100%    Last Pain:  Vitals:   11/03/17 0325  TempSrc: Oral  PainSc:                  Lowella Curb

## 2017-11-03 NOTE — Lactation Note (Signed)
This note was copied from a baby's chart. Lactation Consultation Note; Eda Spanish interpreter present for part of my visit, then mom says she does not need her. Mom has not started pumping yet. Is willing to try now because she is leaking milk. Experienced BF mom. Reports her breasts are feeling a little heavier this morning. Encouraged to pump q 3 hours- 8 times/24 hours. Reviewed setup, use and cleaning of pump pieces. RN will assist mom when able to get pump to room. No questions at present. To call prn  Patient Name: Julie Ellis [id number] Date: 11/03/2017 Reason for consult: Follow-up assessment;NICU baby;Late-preterm 34-36.6wks   Maternal Data Formula Feeding for Exclusion: Yes Reason for exclusion: Mother's choice to formula and breast feed on admission Has patient been taught Hand Expression?: Yes Does the patient have breastfeeding experience prior to this delivery?: Yes  Feeding    LATCH Score                   Interventions    Lactation Tools Discussed/Used WIC Program: Yes Pump Review: Setup, frequency, and cleaning   Consult Status Consult Status: Follow-up Date: 11/04/17 Follow-up type: In-patient    Pamelia Hoit 11/03/2017, 8:20 AM

## 2017-11-03 NOTE — Plan of Care (Signed)
  Problem: Education: Goal: Knowledge of General Education information will improve Outcome: Progressing   Problem: Clinical Measurements: Goal: Ability to maintain clinical measurements within normal limits will improve Outcome: Progressing   Problem: Clinical Measurements: Goal: Will remain free from infection Outcome: Progressing   Problem: Clinical Measurements: Goal: Diagnostic test results will improve Outcome: Progressing   Problem: Activity: Goal: Risk for activity intolerance will decrease Outcome: Progressing   Problem: Coping: Goal: Level of anxiety will decrease Outcome: Progressing

## 2017-11-03 NOTE — Progress Notes (Signed)
Blood transfusion completed. Patient tolerated well. No reactions noted.

## 2017-11-03 NOTE — Progress Notes (Signed)
Subjective: Postpartum Day 1: Cesarean Delivery Patient reports no problems today except for some slight incisional pain. Tolerated blood transfusion without problems. Denies HA, lightheadedness, or problems with ambulating or voiding.  Pain controlled. Tolerating diet.   Objective: Vital signs in last 24 hours: Temp:  [98.1 F (36.7 C)-99.1 F (37.3 C)] 98.1 F (36.7 C) (05/16 0952) Pulse Rate:  [71-130] 93 (05/16 0952) Resp:  [16-20] 18 (05/16 0952) BP: (93-142)/(57-104) 93/72 (05/16 0952) SpO2:  [96 %-100 %] 98 % (05/16 0952) Weight:  [76.7 kg (169 lb)] 76.7 kg (169 lb) (05/16 0901)  Physical Exam:  General: alert Lochia: appropriate Uterine Fundus: firm Incision: healing well DVT Evaluation: No evidence of DVT seen on physical exam.  Recent Labs    11/02/17 1816 11/03/17 0555  HGB 7.2* 8.3*  HCT 20.9* 24.4*    Assessment/Plan: POD # 1 LTCS d/t placental abruption Anemia secondary to above, s/p blood transfusion CHTN  Doing well. H & H stable after transfusion. Will start iron supplement. BP stable on Procardia. Continue with progressive care.   Hermina Staggers 11/03/2017, 10:50 AM

## 2017-11-04 LAB — BPAM RBC
BLOOD PRODUCT EXPIRATION DATE: 201906082359
Blood Product Expiration Date: 201906082359
ISSUE DATE / TIME: 201905152100
ISSUE DATE / TIME: 201905160030
UNIT TYPE AND RH: 5100
UNIT TYPE AND RH: 5100

## 2017-11-04 LAB — TYPE AND SCREEN
ABO/RH(D): O POS
ANTIBODY SCREEN: NEGATIVE
UNIT DIVISION: 0
Unit division: 0

## 2017-11-04 NOTE — Progress Notes (Signed)
Subjective: Postpartum Day 2: Cesarean Delivery d/t placental abruption Patient reports no problems today. Ambulating and voiding with out problems. Tolerating diet. Pain controlled.  Objective: Vital signs in last 24 hours: Temp:  [98.1 F (36.7 C)-98.5 F (36.9 C)] 98.1 F (36.7 C) (05/17 0821) Pulse Rate:  [89-97] 97 (05/17 0821) Resp:  [16-18] 18 (05/17 0821) BP: (102-132)/(72-91) 132/91 (05/17 0821) SpO2:  [91 %-98 %] 97 % (05/17 8295)  Physical Exam:  General: alert Lochia: appropriate Uterine Fundus: firm Incision: healing well DVT Evaluation: No evidence of DVT seen on physical exam.  Recent Labs    11/02/17 1816 11/03/17 0555  HGB 7.2* 8.3*  HCT 20.9* 24.4*    Assessment/Plan: POD # 2 LTCS CHTN  Stable. BP stable on Procardia. Plan for discharge home tomorrow.   Hermina Staggers 11/04/2017, 1:59 PM

## 2017-11-05 MED ORDER — IBUPROFEN 600 MG PO TABS: 600.0000 mg | ORAL_TABLET | Freq: Four times a day (QID) | ORAL | 0 refills | Status: DC

## 2017-11-05 MED ORDER — HYDROCODONE-ACETAMINOPHEN 5-325 MG PO TABS: 1.0000 | ORAL_TABLET | Freq: Four times a day (QID) | ORAL | 0 refills | Status: DC | PRN

## 2017-11-05 MED ORDER — LISINOPRIL 5 MG PO TABS: 5.0000 mg | ORAL_TABLET | Freq: Every day | ORAL | 0 refills | Status: DC

## 2017-11-05 MED ORDER — FERROUS SULFATE 325 (65 FE) MG PO TABS: 325.0000 mg | ORAL_TABLET | Freq: Two times a day (BID) | ORAL | 3 refills | Status: DC

## 2017-11-05 NOTE — Discharge Summary (Signed)
OB Discharge Summary     Patient Name: Julie Ellis DOB: 10/30/1980 MRN: [id number]  Date of admission: 11/02/2017 Delivering MD: Levie Heritage   Date of discharge: 11/05/2017  Admitting diagnosis: 32 WEEKS SYNCOPE EPISODE PAIN NAUSEA,  Abruptio placenta Intrauterine pregnancy: [redacted]w[redacted]d     Secondary diagnosis:  Principal Problem:   Placental abruption Active Problems:   Status post primary low transverse cesarean section  Additional problems:      Discharge diagnosis: Preterm Pregnancy Delivered                                                                                      Abruptio placenta                                                     Acute anemia of blood loss due to abruption           Post partum procedures:blood transfusion x 4 units.  Augmentation: none  Complications: Placental Abruption upon arrival to hospital  Hospital course:  Onset of Labor With Unplanned C/S Julie Ellis is a 37 y.o. female presenting for uterine contractions since earlier this evening. Upon registering, she passed out at desk.  States she also passed out at home.  Upon admission, FHR noted to be in the 70s.  Usual intrauterine resuscitation measures were performed.  Two IVs were started and fluid bolused.  As BPs went up, so did FHR.  Dr Adrian Blackwater was called and arrived at bedside.   States is followed in clinic and that pregnancy has been.remarkable for AMA and chronic hypertension.  Denies bleeding or leaking.      37 y.o. yo [id number] at [redacted]w[redacted]d was admitted in significant discomfort due to abruption on 11/02/2017. Patient had a labor course significant for u/s in MAU identified a large clot at the posterior placenta. She went directly to the OR.. Membrane Rupture Time/Date: 5:17 AM ,11/02/2017   The patient went for cesarean section due to abruptio placenta, and delivered a Viable infant,11/02/2017  Details of operation can be found in separate operative note. Patient had  an complicated postpartum course, notable for transfusion x 4 units prbc She is ambulating,tolerating a regular diet, passing flatus, and urinating well.  Patient is discharged home in stable condition 11/05/17.  Physical exam  Vitals:   11/04/17 2003 11/05/17 0437 11/05/17 0809 11/05/17 1203  BP: (!) 127/91 (!) 125/96 (!) 110/94 125/90  Pulse: 93 85 81 94  Resp: Temp:  98 F (36.7 C) 98.2 F (36.8 C) 98.4 F (36.9 C)  TempSrc: Oral Oral Oral Oral  SpO2: 99% 100% 100% 100%  Weight:       General: alert, cooperative and no distress Lochia: appropriate Uterine Fundus:        Difficult to palpate  Incision: Healing well with no significant drainage, No significant erythema, Dressing is clean, dry, and intact DVT Evaluation: No evidence of DVT seen on physical exam. Negative Homan's sign. No cords or calf tenderness. Labs: Lab Results  Component Value Date   WBC 21.4 (H) 11/03/2017   HGB 8.3 (L) 11/03/2017   HCT 24.4 (L) 11/03/2017   MCV 92.4 11/03/2017   PLT 166 11/03/2017   CBC Latest Ref Rng & Units 11/03/2017 11/02/2017 11/02/2017  WBC 4.0 - 10.5 K/uL 21.4(H) - 20.8(H)  Hemoglobin 12.0 - 15.0 g/dL 8.3(L) 7.2(L) 8.7(L)  Hematocrit 36.0 - 46.0 % 24.4(L) 20.9(L) 25.8(L)  Platelets 150 - 400 K/uL 166 - 191    CMP Latest Ref Rng & Units 11/03/2017  Glucose 65 - 99 mg/dL -  BUN 6 - 20 mg/dL -  Creatinine 1.61 - 0.96 mg/dL 0.45  Sodium 409 - 811 mmol/L -  Potassium 3.5 - 5.2 mmol/L -  Chloride 96 - 106 mmol/L -  CO2 20 - 29 mmol/L -  Calcium 8.7 - 10.2 mg/dL -  Total Protein 6.0 - 8.5 g/dL -  Total Bilirubin 0.0 - 1.2 mg/dL -  Alkaline Phos 39 - 914 IU/L -  AST 0 - 40 IU/L -  ALT 0 - 32 IU/L -    Discharge instruction: per After  Visit Summary and "Baby and Me Booklet".  After visit meds:  Allergies as of 11/05/2017   No Known Allergies     Medication List    STOP taking these medications   aspirin EC 81 MG tablet   folic acid 800 MCG tablet Commonly known as:  FOLVITE   labetalol 200 MG tablet Commonly known as:  NORMODYNE     TAKE these medications   ferrous sulfate 325 (65 FE) MG tablet Take 1 tablet (325 mg total) by mouth 2 (two) times daily with a meal.   HYDROcodone-acetaminophen 5-325 MG tablet Commonly known as:  NORCO/VICODIN Take 1 tablet by mouth every 6 (six) hours as needed for moderate pain. May take with ibuprofen   ibuprofen 600 MG tablet Commonly known as:  ADVIL,MOTRIN Take 1 tablet (600 mg total) by mouth every 6 (six) hours.   lisinopril 5 MG tablet Commonly known as:  PRINIVIL Take 1 tablet (5 mg total) by mouth daily.   Prenatal Vitamins 0.8 MG tablet Take 1 tablet by mouth daily.       Diet: routine diet  Activity: Advance as tolerated. Pelvic rest for 6 weeks.   Outpatient follow up:2 weeks Follow up Appt: Future Appointments  Date Time Provider Department Center  11/23/2017  9:15 AM [facility] NURSE [facility]  12/12/2017  3:55 PM Adrian Blackwater Rhona Raider, DO [facility]   Follow up Visit:No follow-ups on file.  Postpartum contraception: Nexplanon  Newborn Data: Live born female  Birth Weight: 4 lb 12.5 oz (2170 g) APGAR: 2, 7  Newborn Delivery   Birth date/time:  11/02/2017 05:17:00 Delivery type:  C-Section, Low Transverse Trial of labor:  No C-section categorization:  Primary     Baby Feeding: Bottle and Breast Disposition:NICU Patient Name: Girl Oddie Kuhlmann [id number] Date: 11/05/2017 Reason for consult: Follow-up assessment;Late-preterm 34-36.6wks;NICU baby;Infant < 6lbs   Maternal Data Formula Feeding for Exclusion: Yes Reason for exclusion: Mother's choice to formula and breast feed on admission Has patient been taught Hand  Expression?: Yes Does the patient have breastfeeding experience prior to this delivery?: Yes  Feeding Feeding Type: Donor Breast Milk    11/05/2017 Tilda Burrow,  MD   

## 2017-11-05 NOTE — Progress Notes (Signed)
Pt Sleeping

## 2017-11-05 NOTE — Discharge Instructions (Signed)
Reposo plvico (Pelvic Rest) CUNDO SE RECOMIENDA EL REPOSO PLVICO? El reposo plvico puede recomendarse en los siguientes casos:  La placenta cubre de forma parcial o total la abertura del cuello del tero (placenta previa).  Hay sangrado entre la pared del tero y el saco amnitico en el primer trimestre de Psychiatrist (hemorragia subcorinica).  El [place] de parto comienza muy pronto (trabajo de parto prematuro). Segn la salud general de la madre y el feto, el mdico decidir si el reposo plvico es [place]. CMO HAGO REPOSO PLVICO? Durante el tiempo que le indique el mdico:  No tenga relaciones sexuales, estimulacin sexual ni orgasmos.  No use tampones. No se haga duchas vaginales. No se introduzca nada en la vagina.  No levante ningn objeto que pese ms de 10libras (4,5kg).  Evite las actividades que demanden mucho esfuerzo (extenuantes).  Evite las actividades que requieran esfuerzos de los msculos de la pelvis. CUNDO DEBO BUSCAR ATENCIN MDICA? Solicite atencin mdica de inmediato si:  Tiene clicos en la zona inferior del abdomen.  Tiene secrecin de flujo vaginal.  Tiene un dolor sordo en la parte baja de la espalda.  Tiene contracciones regulares.  Tienen tensin uterina. CUNDO DEBO BUSCAR ASISTENCIA MDICA INMEDIATA? Solicite atencin mdica de inmediato si:  Tiene sangrado vaginal y est embarazada. Esta informacin no tiene Theme park manager el consejo del mdico. Asegrese de hacerle al mdico cualquier pregunta que tenga. Document Released: 03/01/2012 Document Revised: 09/29/2015 Document Reviewed: 12/09/2014 Elsevier Interactive Patient Education  2018 [employer].  Parto por Lonna Duval, cuidados posteriores Cesarean Delivery, Care After Siga estas instrucciones durante las prximas semanas. Estas indicaciones le proporcionan informacin acerca de cmo deber cuidarse despus del procedimiento. Su mdico tambin podr darle indicaciones  ms especficas. El tratamiento ha sido planificado segn las prcticas mdicas actuales, pero en algunos casos pueden ocurrir problemas. Comunquese con el mdico si tiene algn problema o preguntas despus del procedimiento. Qu puedo esperar despus del procedimiento? Despus del procedimiento, es comn [employer] siguientes sntomas:  Una pequea cantidad de sangre o un lquido transparente que sale de la incisin.  Tiene enrojecimiento, hinchazn o dolor en la zona de la incisin.  Dolores y [employer] .  Hemorragia vaginal (loquios).  Calambres plvicos.  Fatiga.  Siga estas indicaciones en su casa: Cuidados de la incisin   Siga las indicaciones del mdico acerca del cuidado de la incisin. Haga lo siguiente: ? Lvese las manos con agua y jabn antes de cambiar la venda (vendaje). Use desinfectante para manos si no dispone de [place] y [place]. ? Cambie el vendaje como se lo haya indicado el mdico. ? No retire los puntos (suturas), las grapas cutneas, la goma para cerrar la piel o las tiras [place]. Es posible que estos cierres cutneos Conservation officer, nature en la piel durante 2semanas o ms tiempo. Si los bordes de las tiras [address] empiezan a despegarse y Scientific laboratory technician, puede recortar los que estn sueltos. No retire las tiras [employer] por completo a menos que el mdico se lo indique.  Controle todos los das la zona de la incisin para detectar signos de infeccin. Est atenta a los siguientes signos: ? Aumento del enrojecimiento, la hinchazn o Chief Technology Officer. ? Mayor presencia de lquido o [place]. ? Calor. ? Pus o mal olor.  Cuando tosa o estornude, abrace [employer]. Esto ayuda con el dolor y [employer] posibilidad de que su incisin se abra (dehiscencia). Haga esto hasta que cicatrice completamente. Medicamentos  [employer] de venta libre y los recetados  solamente como se lo haya indicado el mdico.  Si le recetaron un antibitico, tmelo como se lo  haya indicado el mdico. No interrumpa la administracin del antibitico hasta que lo haya terminado. Conducir  No conduzca ni opere maquinaria pesada mientras toma analgsicos recetados.  No conduzca durante 24horas si le administraron un sedante. Estilo de vida  No beba alcohol. Esto es de suma importancia si est amamantando o toma analgsicos.  No consuma productos que contengan tabaco, incluidos cigarrillos, tabaco de Theatre manager o cigarrillos electrnicos. Si necesita ayuda para dejar de fumar, consulte al mdico. El tabaco puede retrasar la cicatrizacin. Qu debe comer y beber  Beba al menos 8vasos de ochoonzas (240cc) de agua todos los [address]  [address] a menos que el mdico le indique lo contrario. Si amamanta, quiz deba beber an ms cantidad de agua.  Coma alimentos ricos en [employer]. Estos alimentos pueden ayudarla a prevenir o Educational psychologist. Los alimentos ricos en fibras incluyen, entre otros: ? Panes y cereales integrales. ? Arroz integral. ? Armed forces operational officer. ? Nils Pyle y verduras frescas. Actividad  Retome sus actividades normales como se lo haya indicado el mdico. Pregntele al mdico qu actividades son seguras para usted.  Descanse todo lo que pueda. Trate de descansar o tomar una siesta mientras el beb duerme.  No levante objetos que pesen ms que su beb o ms de 10 libras (4,5 kg), como se lo haya indicado el mdico.  Pregntele al mdico cundo puede retomar la actividad sexual. Esto puede depender de lo siguiente: ? Riesgo de sufrir una infeccin. ? Velocidad de cicatrizacin. ? Comodidad y deseo de [employer] sexual. Baarse  No tome baos de inmersin, no nade ni use el jacuzzi hasta que el mdico lo autorice. Pregntele al mdico si puede ducharse. Delle Reining solo le permitan darse baos de esponja hasta que la incisin se cure.  Mantenga el vendaje seco, como se lo haya indicado el mdico. Instrucciones generales  No use tampones ni se  haga duchas vaginales hasta que el mdico la autorice.  Use lo siguiente: ? Ropa cmoda y suelta. ? Un sostn firme y [place].  Controle la sangre que elimina por la vagina para detectar cogulos de [place]. Estos pueden tener el aspecto de grumos de color rojo oscuro, o secrecin marrn o negra.  Mantenga el perineo limpio y seco, como se lo haya indicado el mdico.  Cuando vaya al bao, siempre higiencese de adelante hacia atrs.  Si es posible, pdale a alguien que la ayude a cuidar de su beb y con las tareas del hogar durante [employer] despus de que le den el alta del hospital.  Chauncy Passy a todas las visitas de seguimiento para usted y el beb, como se lo haya indicado el mdico. Esto es importante. Comunquese con un mdico si:  Tiene los siguientes sntomas: ? Una secrecin vaginal con mal olor. ? Dificultad para orinar. ? Dolor al [employer]. ? Aumento o disminucin repentinos de la frecuencia de las deposiciones. ? Aumento del enrojecimiento, la hinchazn o el dolor alrededor de la incisin. ? Aumento del lquido o de la sangre que sale de la incisin. ? Pus o mal olor en Immunologist de la incisin. ? Fiebre. ? Erupcin cutnea. ? Poco inters o falta de inters en actividades que solan gustarle. ? Dudas sobre su cuidado y el del beb. ? Nuseas.  La incisin est caliente al tacto.  Siente dolor en las mamas y se ponen rojas o duras.  Siente tristeza o preocupacin de forma inusual.  Vomita.  Elimina cogulos de sangre grandes por la vagina. Si expulsa un cogulo de sangre, gurdelo para mostrrselo al [employer]. No tire la cadena sin mostrarle los cogulos de sangre a su mdico.  Orina ms de lo habitual.  Se siente mareada o se desmaya.  No ha amamantado y no ha tenido un perodo menstrual durante 12 semanas despus del [place].  Dej de amamantar al beb y no ha tenido su perodo menstrual durante 12 semanas despus de haber dejado de Museum/gallery exhibitions officer. Solicite  ayuda de inmediato si:  Tiene los siguientes sntomas: ? Dolor que no desaparece o no mejora con medicamentos. ? Journalist, newspaper. ? Dificultad para respirar. ? Visin borrosa o Nurse, adult. ? Pensamientos de autolesionarse o lesionar al beb. ? Air cabin crew abdomen o en una de las piernas. ? Dolor de cabeza intenso.  Se desmaya.  Tiene una hemorragia tan intensa de la vagina que [employer] compresas higinicas en Georgianne Fick. Esta informacin no tiene Theme park manager el consejo del mdico. Asegrese de hacerle al mdico cualquier pregunta que tenga. Document Released: 06/07/2005 Document Revised: 09/27/2016 Document Reviewed: 05/12/2015 Elsevier Interactive Patient Education  [employer].

## 2017-11-05 NOTE — Care Management (Signed)
Pt reports she has no money to fill prescriptions.  Pt set up for [facility] program with $0 copay and override for Vicodin.  Letter and pharmacy list faxed to RN to be given to patient with d/c papers.

## 2017-11-05 NOTE — Lactation Note (Signed)
This note was copied from a baby's chart. Lactation Consultation Note; Mom reports she pumped 2 times yesterday. Discouraged that she is not obtaining much milk. Breasts are filling this morning. Encouraged to pump q 3 hours- 8 times/24 hours. Encouraged breast massage before pumping and hand expression after wards. Mom wants to eat breakfast first then pump. I showed her how to use DEBP pieces as manual pump for home. No questions at present. To call prn  Patient Name: Julie Ellis [id number] Date: 11/05/2017 Reason for consult: Follow-up assessment;Late-preterm 34-36.6wks;NICU baby;Infant < 6lbs   Maternal Data Formula Feeding for Exclusion: Yes Reason for exclusion: Mother's choice to formula and breast feed on admission Has patient been taught Hand Expression?: Yes Does the patient have breastfeeding experience prior to this delivery?: Yes  Feeding Feeding Type: Donor Breast Milk  LATCH Score       Type of Nipple: Everted at rest and after stimulation  Comfort (Breast/Nipple): Filling, red/small blisters or bruises, mild/mod discomfort        Interventions Interventions: Hand express  Lactation Tools Discussed/Used     Consult Status Consult Status: PRN    Pamelia Hoit 11/05/2017, 9:08 AM

## 2017-11-06 ENCOUNTER — Encounter (HOSPITAL_COMMUNITY): Payer: Self-pay | Admitting: *Deleted

## 2017-11-06 ENCOUNTER — Inpatient Hospital Stay (HOSPITAL_COMMUNITY)
Admission: AD | Admit: 2017-11-06 | Discharge: 2017-11-06 | Disposition: A | Discharge status: Home / Self Care | Payer: Self-pay | Source: Ambulatory Visit | Attending: Obstetrics & Gynecology | Admitting: Obstetrics & Gynecology

## 2017-11-06 DIAGNOSIS — Z87891 Personal history of nicotine dependence: Secondary | ICD-10-CM | POA: Insufficient documentation

## 2017-11-06 DIAGNOSIS — Z79899 Other long term (current) drug therapy: Secondary | ICD-10-CM | POA: Insufficient documentation

## 2017-11-06 DIAGNOSIS — I10 Essential (primary) hypertension: Secondary | ICD-10-CM | POA: Insufficient documentation

## 2017-11-06 DIAGNOSIS — Z4800 Encounter for change or removal of nonsurgical wound dressing: Secondary | ICD-10-CM | POA: Insufficient documentation

## 2017-11-06 MED ORDER — LISINOPRIL 5 MG PO TABS
5.0000 mg | ORAL_TABLET | Freq: Every day | ORAL | Status: DC
  Administered 2017-11-06: 5 mg via ORAL
  Filled 2017-11-06 (×2): qty 1

## 2017-11-06 MED ORDER — IBUPROFEN 600 MG PO TABS: 600.0000 mg | ORAL_TABLET | Freq: Four times a day (QID) | ORAL | Status: DC | PRN

## 2017-11-06 MED ORDER — OXYCODONE-ACETAMINOPHEN 5-325 MG PO TABS
1.0000 | ORAL_TABLET | Freq: Four times a day (QID) | ORAL | Status: DC | PRN
  Administered 2017-11-06: 2 via ORAL
  Filled 2017-11-06: qty 2

## 2017-11-06 NOTE — Care Management (Signed)
Update 11/06/17 1715:  Patient has had multiple problems filling prescriptions with MATCH letter, partially due to not following instructions. Overrides done and verified by [facility] pharmacist, Coralee North so that patient would have no co-pays and get Vicodin.  Spoke with [facility]ist at [facility]) and MATCH covered lisinopril and ibuprofen.  Ferrous Sulfate is not covered because it is OTC and costs $3.  Narcotics require a pre-authorization that was done by pharmacist, Coralee North, but cannot be processed until tomorrow per [facility] pharmacist.  With a Good RX coupon, Vicodin will be around $10.  Total cost will be less than $14 or the patient can wait until tomorrow to get Vicodin at no cost.  There are no other options to help this patient with the cost of prescriptions.

## 2017-11-06 NOTE — MAU Note (Signed)
Patient reports that she cannot afford her medication and that the pharmacy would not give them to her despite MATCH form.   RN contacted Care Management whom will call back.

## 2017-11-06 NOTE — Discharge Instructions (Signed)
Parto por Lonna Duvalcesrea, cuidados posteriores Cesarean Delivery, Care After Siga estas instrucciones durante las prximas semanas. Estas indicaciones le proporcionan informacin acerca de cmo deber cuidarse despus del procedimiento. Su mdico tambin podr darle indicaciones ms especficas. El tratamiento ha sido planificado segn las prcticas mdicas actuales, pero en algunos casos pueden ocurrir problemas. Comunquese con el mdico si tiene algn problema o preguntas despus del procedimiento. Qu puedo esperar despus del procedimiento? Despus del procedimiento, es comn DIRECTVtener los siguientes sntomas:  Una pequea cantidad de sangre o un lquido transparente que sale de la incisin.  Tiene enrojecimiento, hinchazn o dolor en la zona de la incisin.  Dolores y [employer] molestias abdominales.  Hemorragia vaginal (loquios).  Calambres plvicos.  Fatiga.  Siga estas indicaciones en su casa: Cuidados de la incisin   Siga las indicaciones del mdico acerca del cuidado de la incisin. Haga lo siguiente: ? Lvese las manos con agua y jabn antes de cambiar la venda (vendaje). Use desinfectante para manos si no dispone de [place] y [place]. ? Cambie el vendaje como se lo haya indicado el mdico. ? No retire los puntos (suturas), las grapas cutneas, la goma para cerrar la piel o las tiras Sligoadhes[place]. Es posible que estos cierres cutneos Conservation officer, naturedeban permanecer en la piel durante 2semanas o ms tiempo. Si los bordes de las tiras [address] empiezan a despegarse y Scientific laboratory technicianenroscarse, puede recortar los que estn sueltos. No retire las tiras Agilent Technologiesadhesivas por completo a menos que el mdico se lo indique.  Controle todos los das la zona de la incisin para detectar signos de infeccin. Est atenta a los siguientes signos: ? Aumento del enrojecimiento, la hinchazn o Chief Technology Officerel dolor. ? Mayor presencia de lquido o [place]. ? Calor. ? Pus o mal olor.  Cuando tosa o estornude, abrace Rockwell Automationuna almohada. Esto ayuda con el dolor y Apple Computerdisminuye  la posibilidad de que su incisin se abra (dehiscencia). Haga esto hasta que cicatrice completamente. Medicamentos  CenterPoint Energyome los medicamentos de venta libre y los recetados solamente como se lo haya indicado el mdico.  Si le recetaron un antibitico, tmelo como se lo haya indicado el mdico. No interrumpa la administracin del antibitico hasta que lo haya terminado. Conducir  No conduzca ni opere maquinaria pesada mientras toma analgsicos recetados.  No conduzca durante 24horas si le administraron un sedante. Estilo de vida  No beba alcohol. Esto es de suma importancia si est amamantando o toma analgsicos.  No consuma productos que contengan tabaco, incluidos cigarrillos, tabaco de Theatre managermascar o cigarrillos electrnicos. Si necesita ayuda para dejar de fumar, consulte al mdico. El tabaco puede retrasar la cicatrizacin. Qu debe comer y beber  Beba al menos 8vasos de ochoonzas (240cc) de agua todos los [address]  [address] a menos que el mdico le indique lo contrario. Si amamanta, quiz deba beber an ms cantidad de agua.  Coma alimentos ricos en Enbridge Energyfibras todos los das. Estos alimentos pueden ayudarla a prevenir o Educational psychologistaliviar el estreimiento. Los alimentos ricos en fibras incluyen, entre otros: ? Panes y cereales integrales. ? Arroz integral. ? Armed forces operational officerrijoles. ? Nils PyleFrutas y verduras frescas. Actividad  Retome sus actividades normales como se lo haya indicado el mdico. Pregntele al mdico qu actividades son seguras para usted.  Descanse todo lo que pueda. Trate de descansar o tomar una siesta mientras el beb duerme.  No levante objetos que pesen ms que su beb o ms de 10 libras (4,5 kg), como se lo haya indicado el mdico.  Pregntele al mdico cundo puede retomar la actividad sexual. Esto puede depender de  lo siguiente: ? Riesgo de sufrir una infeccin. ? Velocidad de cicatrizacin. ? Comodidad y deseo de [employer] sexual. Baarse  No tome baos de inmersin, no nade ni use el jacuzzi  hasta que el mdico lo autorice. Pregntele al mdico si puede ducharse. Delle Reining solo le permitan darse baos de esponja hasta que la incisin se cure.  Mantenga el vendaje seco, como se lo haya indicado el mdico. Instrucciones generales  No use tampones ni se haga duchas vaginales hasta que el mdico la autorice.  Use lo siguiente: ? Ropa cmoda y suelta. ? Un sostn firme y [place].  Controle la sangre que elimina por la vagina para detectar cogulos de [place]. Estos pueden tener el aspecto de grumos de color rojo oscuro, o secrecin marrn o negra.  Mantenga el perineo limpio y seco, como se lo haya indicado el mdico.  Cuando vaya al bao, siempre higiencese de adelante hacia atrs.  Si es posible, pdale a alguien que la ayude a cuidar de su beb y con las tareas del hogar durante [employer] despus de que le den el alta del hospital.  Chauncy Passy a todas las visitas de seguimiento para usted y el beb, como se lo haya indicado el mdico. Esto es importante. Comunquese con un mdico si:  Tiene los siguientes sntomas: ? Una secrecin vaginal con mal olor. ? Dificultad para orinar. ? Dolor al [employer]. ? Aumento o disminucin repentinos de la frecuencia de las deposiciones. ? Aumento del enrojecimiento, la hinchazn o el dolor alrededor de la incisin. ? Aumento del lquido o de la sangre que sale de la incisin. ? Pus o mal olor en Immunologist de la incisin. ? Fiebre. ? Erupcin cutnea. ? Poco inters o falta de inters en actividades que solan gustarle. ? Dudas sobre su cuidado y el del beb. ? Nuseas.  La incisin est caliente al tacto.  Siente dolor en las mamas y se ponen rojas o duras.  Siente tristeza o preocupacin de forma inusual.  Vomita.  Elimina cogulos de sangre grandes por la vagina. Si expulsa un cogulo de sangre, gurdelo para mostrrselo al [employer]. No tire la cadena sin mostrarle los cogulos de sangre a su mdico.  Orina ms de lo  habitual.  Se siente mareada o se desmaya.  No ha amamantado y no ha tenido un perodo menstrual durante 12 semanas despus del [place].  Dej de amamantar al beb y no ha tenido su perodo menstrual durante 12 semanas despus de haber dejado de Museum/gallery exhibitions officer. Solicite ayuda de inmediato si:  Tiene los siguientes sntomas: ? Dolor que no desaparece o no mejora con medicamentos. ? Journalist, newspaper. ? Dificultad para respirar. ? Visin borrosa o Nurse, adult. ? Pensamientos de autolesionarse o lesionar al beb. ? Air cabin crew abdomen o en una de las piernas. ? Dolor de cabeza intenso.  Se desmaya.  Tiene una hemorragia tan intensa de la vagina que [employer] compresas higinicas en Georgianne Fick. Esta informacin no tiene Theme park manager el consejo del mdico. Asegrese de hacerle al mdico cualquier pregunta que tenga. Document Released: 06/07/2005 Document Revised: 09/27/2016 Document Reviewed: 05/12/2015 Elsevier Interactive Patient Education  [employer].

## 2017-11-06 NOTE — MAU Note (Signed)
Care Management, Morrie Sheldon, called back and confirmed she talked to pharmacist and that patient should be able to get medication. RN to give patient CM number incase of any issues. Dr. Jolayne Panther made aware.

## 2017-11-06 NOTE — MAU Note (Signed)
Patient presents to mau for wound check. Reports she is bleeding through bandage and that it is painful. Burning in nature. Rating pain 8/10. Has not taken anything for pain; reports pharmacy would not give her her pain medication.   Endorses headache yesterday with blurred vision but none today.  Has noticed an increase in swelling in her feet.   Post c/s ; discharged yesterday

## 2017-11-06 NOTE — MAU Provider Note (Signed)
History     CSN: [id number]  Arrival date and time: 11/06/17 1402   None     Chief Complaint  Patient presents with  . Wound Check   HPI 37 yo [id number] s/p primary cesarean section on 5/15 due to placental abruption presenting today for the evaluation of incisional pain. Patient reports onset of pain since discharge from the hospital yesterday. She admits that she was unable to obtain all of her discharge medication due to financial constraints. She denies any headaches, visual changes, RUQ/epigastric pain. She reports that her pain was well controlled while in the hospital. She is breastfeeding via expressed milk. Infant remains in the NICU   OB History    Gravida  5   Para  4   Term  3   Preterm  1   AB  1   Living  4     SAB  1   TAB      Ectopic      Multiple  0   Live Births  4           Past Medical History:  Diagnosis Date  . Breast cyst, right 05/23/2017  . Hypertension    took herself off meds 1 1/2years ago    Past Surgical History:  Procedure Laterality Date  . CESAREAN SECTION N/A 11/02/2017   Procedure: CESAREAN SECTION;  Surgeon: Levie Heritage, DO;  Location: [facility] BIRTHING SUITES;  Service: Obstetrics;  Laterality: N/A;  . DILATION AND CURETTAGE OF UTERUS  02/2016    Family History  Problem Relation Age of Onset  . Epilepsy Mother     Social History   Tobacco Use  . Smoking status: Former Smoker    Packs/day: 0.50    Types: Cigarettes  . Smokeless tobacco: Never Used  Substance Use Topics  . Alcohol use: No  . Drug use: No    Allergies: No Known Allergies  Medications Prior to Admission  Medication Sig Dispense Refill Last Dose  . ferrous sulfate 325 (65 FE) MG tablet Take 1 tablet (325 mg total) by mouth 2 (two) times daily with a meal. 30 tablet 3   . HYDROcodone-acetaminophen (NORCO/VICODIN) 5-325 MG tablet Take 1 tablet by mouth every 6 (six) hours as needed for moderate pain. May take with ibuprofen 15 tablet 0   .  ibuprofen (ADVIL,MOTRIN) 600 MG tablet Take 1 tablet (600 mg total) by mouth every 6 (six) hours. 30 tablet 0   . lisinopril (PRINIVIL,ZESTRIL) 5 MG tablet Take 1 tablet (5 mg total) by mouth daily. 30 tablet 0   . Prenatal Multivit-Min-Fe-FA (PRENATAL VITAMINS) 0.8 MG tablet Take 1 tablet by mouth daily. 30 tablet 12 11/01/2017 at Unknown time    Review of Systems  See pertinent in HPI Physical Exam   Blood pressure (!) 149/101, pulse 95, temperature 98.1 F (36.7 C), temperature source Oral, resp. rate 17, weight 161 lb (73 kg), SpO2 100 %, unknown if currently breastfeeding.  Physical Exam GENERAL: Well-developed, well-nourished female in no acute distress.  ABDOMEN: Soft, nontender, nondistended. Incision: no erythema, induration or drainage. Honey comb dressing intact.  EXTREMITIES: No cyanosis, clubbing, or edema, 2+ distal pulses.  MAU Course  Procedures  MDM   Assessment and Plan  37 yo s/p cesarean section on 5/15 here with incisional pain - Pain management and lisinopril ordered - Care management contacted for assistance in obtaining medication- issue resolved - Follow up as scheduled on 6/5 for BP and incision check  Darnell Stimson  11/06/2017, 3:29 PM

## 2017-11-08 ENCOUNTER — Encounter (HOSPITAL_COMMUNITY): Payer: Self-pay

## 2017-11-08 ENCOUNTER — Ambulatory Visit (HOSPITAL_COMMUNITY): Payer: Self-pay

## 2017-11-08 ENCOUNTER — Encounter: Payer: Self-pay | Admitting: Obstetrics and Gynecology

## 2017-11-23 ENCOUNTER — Ambulatory Visit (INDEPENDENT_AMBULATORY_CARE_PROVIDER_SITE_OTHER): Payer: Self-pay | Admitting: General Practice

## 2017-11-23 ENCOUNTER — Telehealth: Payer: Self-pay | Admitting: Student

## 2017-11-23 VITALS — BP 133/92 | HR 84 | Wt 152.0 lb

## 2017-11-23 DIAGNOSIS — Z5189 Encounter for other specified aftercare: Secondary | ICD-10-CM

## 2017-11-23 DIAGNOSIS — Z0130 Encounter for examination of blood pressure without abnormal findings: Secondary | ICD-10-CM

## 2017-11-23 NOTE — Progress Notes (Signed)
Patient presents to office today for incision & blood pressure check following c-section 2.5 weeks ago. Patient reports daily headaches and has dizziness/blurry vision whenever she feels her BP is too high. Patient reports taking BP medication daily, but has not taken today. Patient denies headaches or visual changes at this time. Reviewed importance of taking BP medication daily. Discussed if her headaches become more severe and blurry vision/dizziness occurs more frequently to go to MAU.  Incision is clean, dry & intact. Some steri strips are still in place. Reviewed wound care and signs & symptoms of infection with patient. Patient has pp visit scheduled 6/24- patient will follow up then.

## 2017-11-23 NOTE — Telephone Encounter (Signed)
Opened in error

## 2017-11-23 NOTE — Progress Notes (Signed)
Chart reviewed for nurse visit. Agree with plan of care.   Judeth HornLawrence, Hadlea Furuya, NP 11/23/2017 10:04 AM

## 2017-12-12 ENCOUNTER — Ambulatory Visit (INDEPENDENT_AMBULATORY_CARE_PROVIDER_SITE_OTHER): Payer: Self-pay | Admitting: Family Medicine

## 2017-12-12 ENCOUNTER — Encounter: Payer: Self-pay | Admitting: Family Medicine

## 2017-12-12 VITALS — BP 145/101 | HR 79 | Wt 160.5 lb

## 2017-12-12 DIAGNOSIS — O10919 Unspecified pre-existing hypertension complicating pregnancy, unspecified trimester: Secondary | ICD-10-CM

## 2017-12-12 DIAGNOSIS — Z98891 History of uterine scar from previous surgery: Secondary | ICD-10-CM

## 2017-12-12 MED ORDER — IBUPROFEN 600 MG PO TABS: 600.0000 mg | ORAL_TABLET | Freq: Four times a day (QID) | ORAL | 0 refills | Status: DC

## 2017-12-12 MED ORDER — LISINOPRIL 10 MG PO TABS: 10.0000 mg | ORAL_TABLET | Freq: Every day | ORAL | 2 refills | Status: AC

## 2017-12-12 NOTE — Progress Notes (Signed)
Subjective:     Julie Ellis is a 37 y.o. female who presents for a postpartum visit. She is 5 weeks postpartum following a low cervical transverse Cesarean section. I have fully reviewed the prenatal and intrapartum course. The delivery was at 39 gestational weeks. Outcome: primary cesarean section, low transverse incision. Anesthesia: spinal. Postpartum course has been normal. Baby's course has been normal. Baby is feeding by bottle - Similac Neosure. Bleeding no bleeding. Bowel function is normal. Bladder function is normal. Patient is not sexually active. Contraception method is abstinence. Postpartum depression screening: negative.  The following portions of the patient's history were reviewed and updated as appropriate: allergies, current medications, past family history, past medical history, past social history, past surgical history and problem list.  Review of Systems Pertinent items are noted in HPI.   Objective:    BP (!) 158/109   Pulse 75   Wt 160 lb 8 oz (72.8 kg)   BMI 29.36 kg/m   General:  alert, cooperative and no distress  Lungs: clear to auscultation bilaterally  Heart:  regular rate and rhythm, S1, S2 normal, no murmur, click, rub or gallop  Abdomen: soft, non-tender; bowel sounds normal; no masses,  no organomegaly and incision well healed - left corner slightly opened (2-153mm), but no erythema.        Assessment:     no postpartum exam. Pap smear not done at today's visit.   Plan:    1. Contraception: none - no sexual partner 2. HTN: increase lisinopril to 10mg  daily. Referred to [facility] 3. Follow up in: 1 year or as needed.

## 2019-08-17 IMAGING — US US MFM FETAL NUCHAL TRANSLUCENCY
1 series · 15 of 28 positions shown · non-contrast
Comparison: none

[Series 1: us mfm fetal nuchal translucency · 42 acquisitions, 15 frames shown]
[im 1/42]
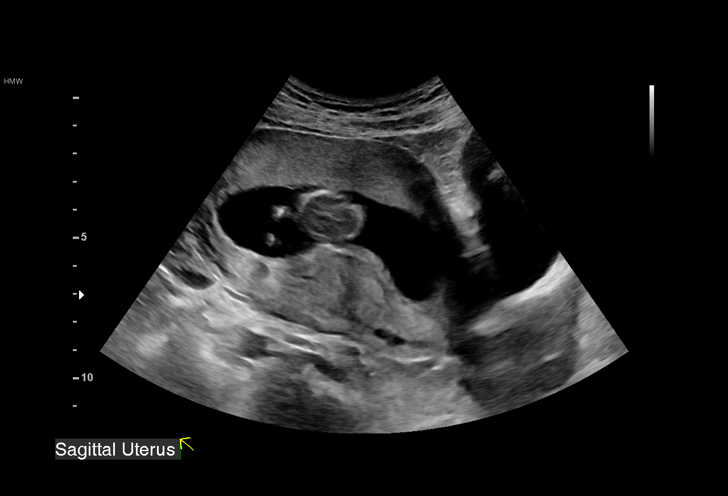
[im 4/42]
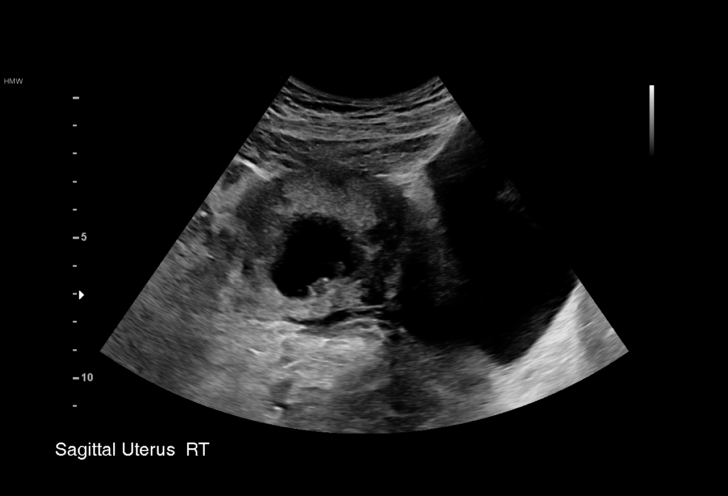
[im 7/42]
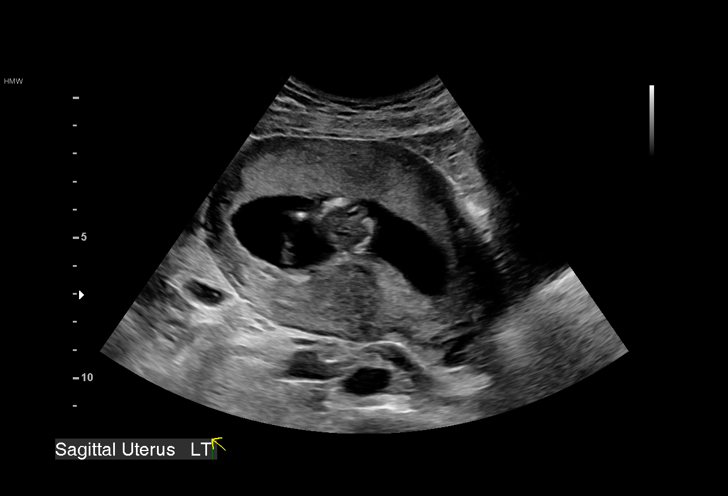
[im 10/42]
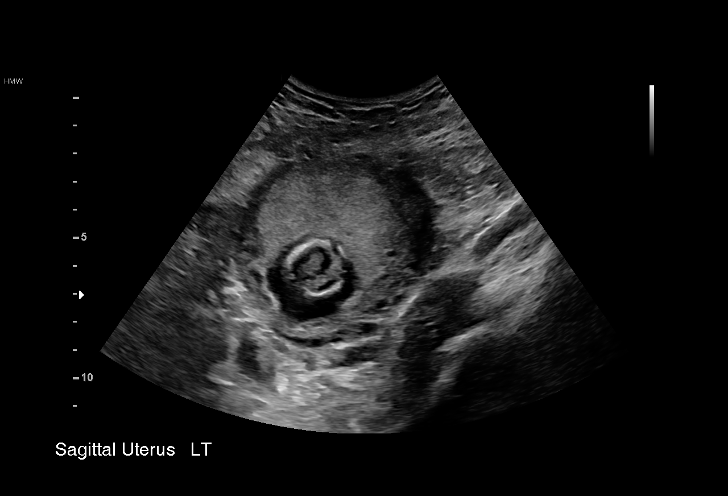
[im 13/42]
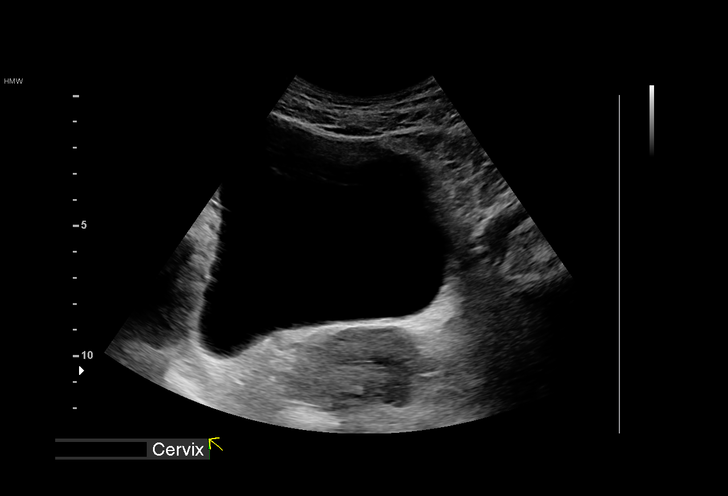
[im 16/42]
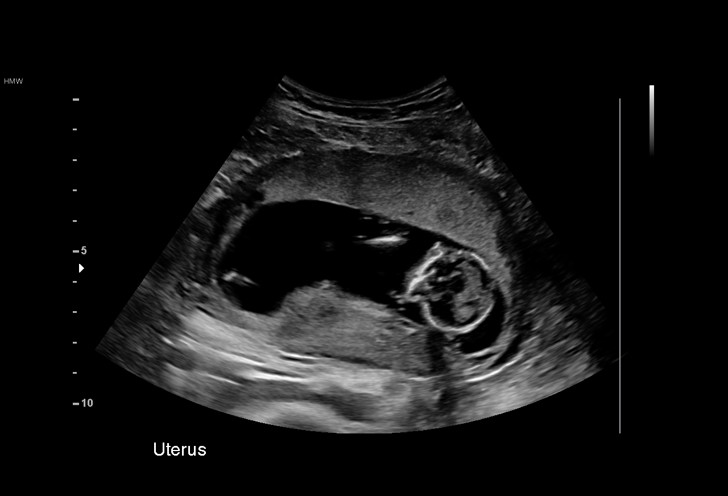
[im 19/42]
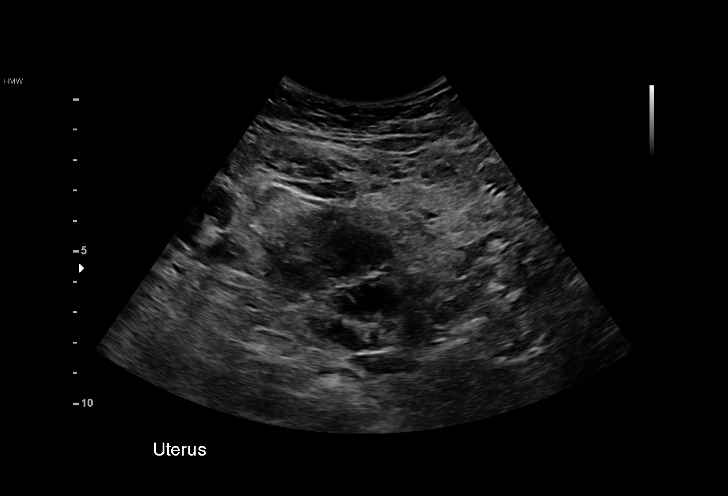
[im 22/42]
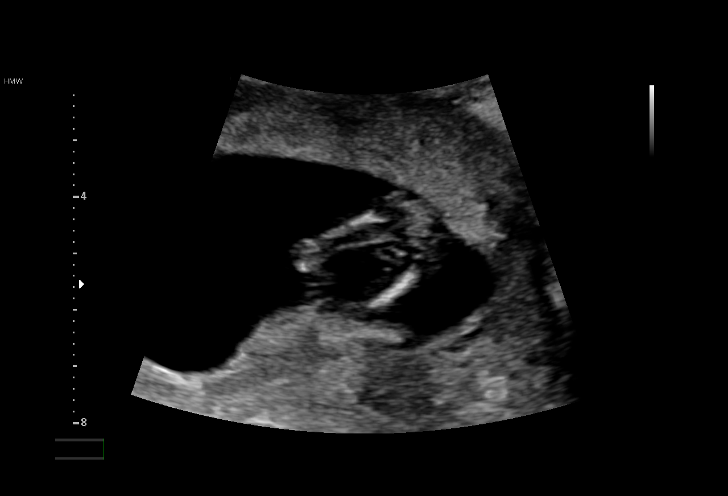
[im 23/42]
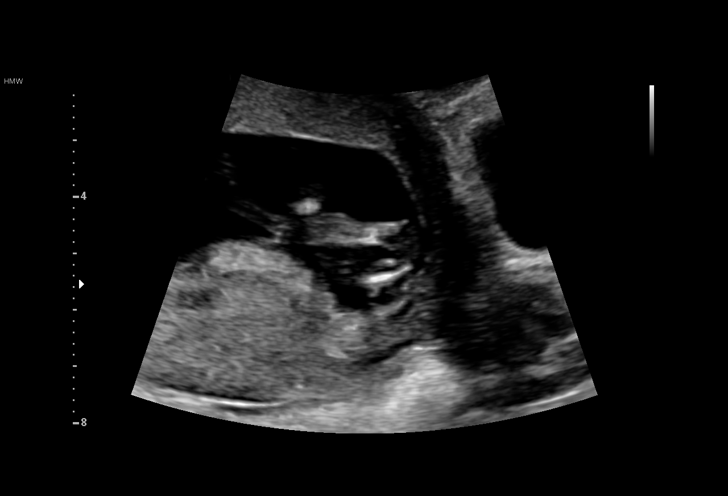
[im 26/42]
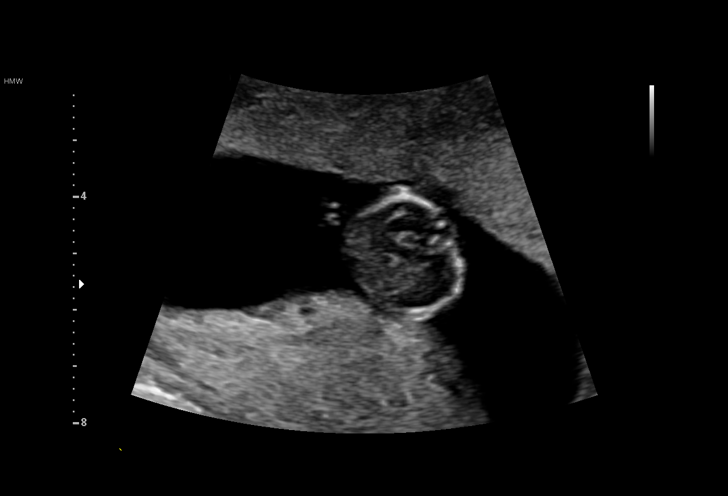
[im 29/42]
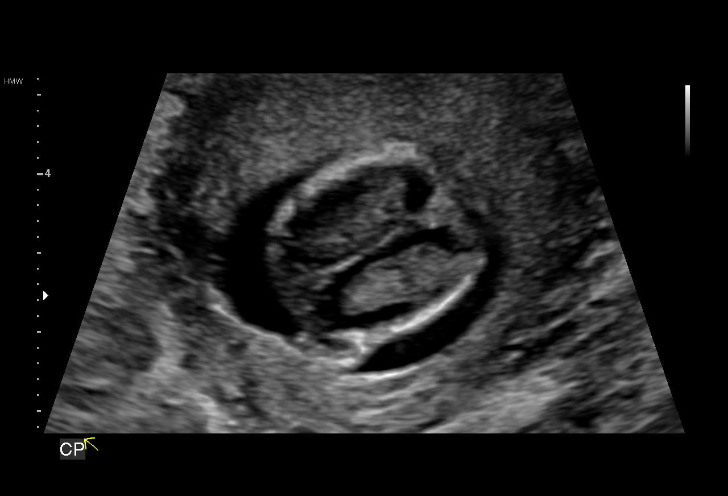
[im 32/42]
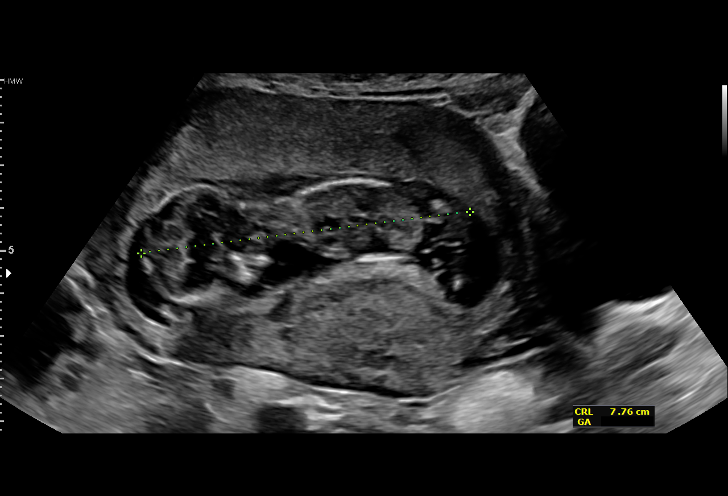
[im 35/42]
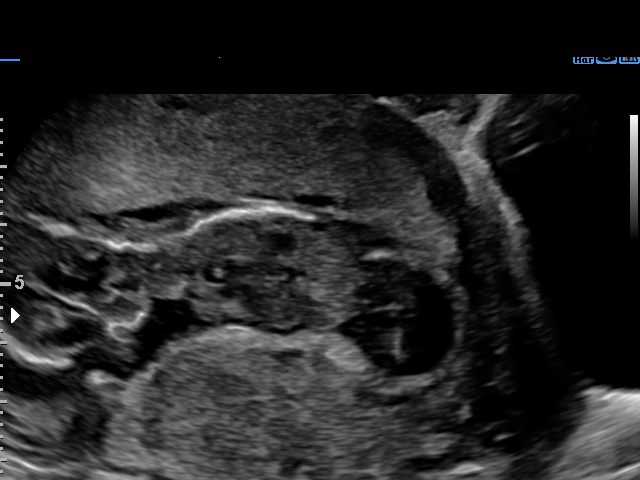
[im 38/42]
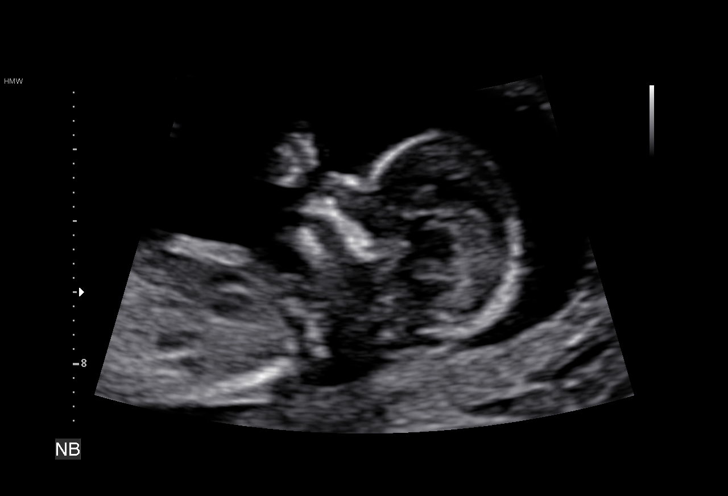
[im 42/42]
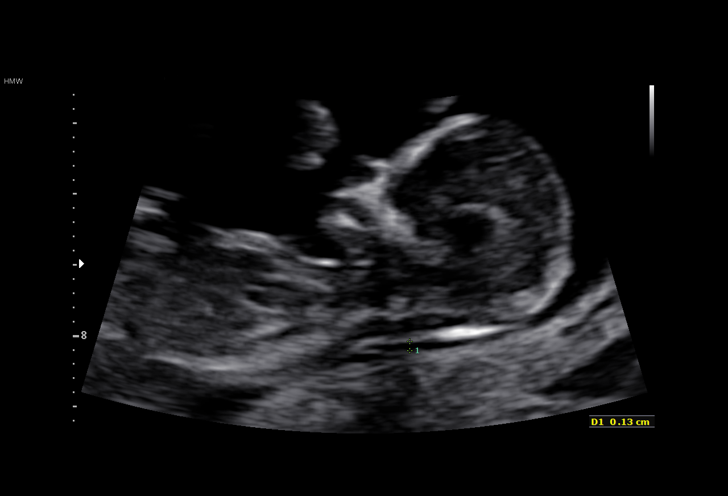

[15 of 28 positions shown; findings below may reference images not displayed]

[facility]

[facility]/Gyn Clinic

TRANSLUCENCY

1  CALEB AUJLA            [id number]       [id number]     [id number]
Indications

13 weeks gestation of pregnancy
Advanced maternal age multigravida 35+,
first trimester
Encounter for nuchal translucency
OB History

Blood Type:            Height:  5'2"   Weight (lb):  151       BMI:
Gravidity:    5         Term:   3        Prem:   0        SAB:   1
TOP:          0       Ectopic:  0        Living: 3
Fetal Evaluation

Num Of Fetuses:     1
Preg. Location:     Intrauterine
Gest. Sac:          Intrauterine
Yolk Sac:           Not visualized
Fetal Pole:         Visualized
Fetal Heart         151
Rate(bpm):
Cardiac Activity:   Observed

Amniotic Fluid
AFI FV:      Subjectively within normal limits
Biometry

CRL:      76.3  mm     G. Age:  13w 3d                  EDD:   12/11/17
Gestational Age

LMP:           13w 2d        Date:  03/07/17                 EDD:   12/12/17
Best:          13w 2d     Det. By:  LMP  (03/07/17)          EDD:   12/12/17
1st Trimester Genetic Sonogram Screening

CRL:            76.3  mm    G. Age:   13w 3d                 EDD:   12/11/17
Nuc Trans:       1.6  mm
Nasal Bone:                 Present
Anatomy

Choroid Plexus:        Appears normal         Bladder:                Appears normal
Thoracic:              Appears normal         Upper Extremities:      Visualized
Stomach:               Appears normal, left   Lower Extremities:      Visualized
sided
Cervix Uterus Adnexa

Cervix
Normal appearance by transabdominal scan.

Uterus
No abnormality visualized.

Left Ovary
No adnexal mass visualized.

Right Ovary
No adnexal mass visualized.

Cul De Sac:   No free fluid seen.

Adnexa:       No abnormality visualized.
Impression

IUP at 13+2 weeks with AMA, here for first trimester screening
Normal fetal cardiac activity
Normal fetal morphology; nasal bone visualized
CRL confirms menstrual dating
NT measures 1.6mm
Recommendations

Serum analytes will be drawn today
Recommend anatomic survey in 6 weeks

## 2019-11-21 IMAGING — US US MFM OB FOLLOW-UP
1 series · 14 of 28 positions shown · non-contrast
Comparison: none

[Series 1: us mfm ob follow-up · 33 acquisitions, 14 frames shown]
[im 2/33]
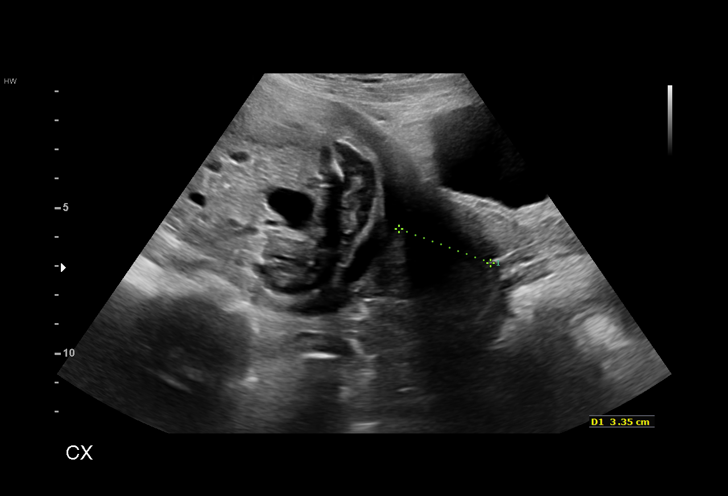
[im 4/33]
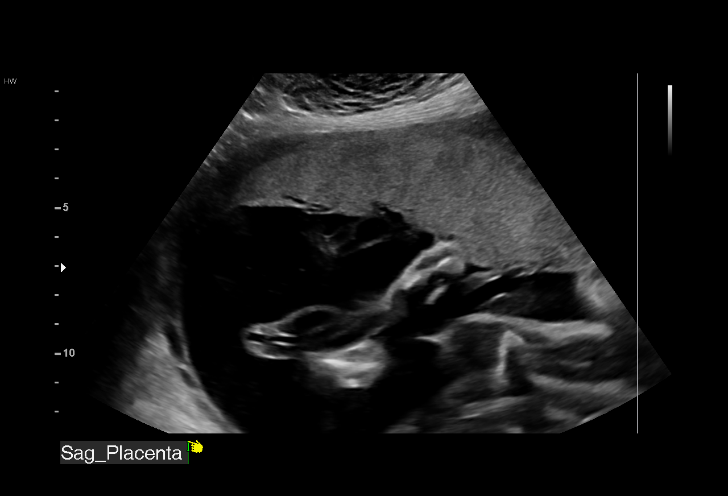
[im 6/33]
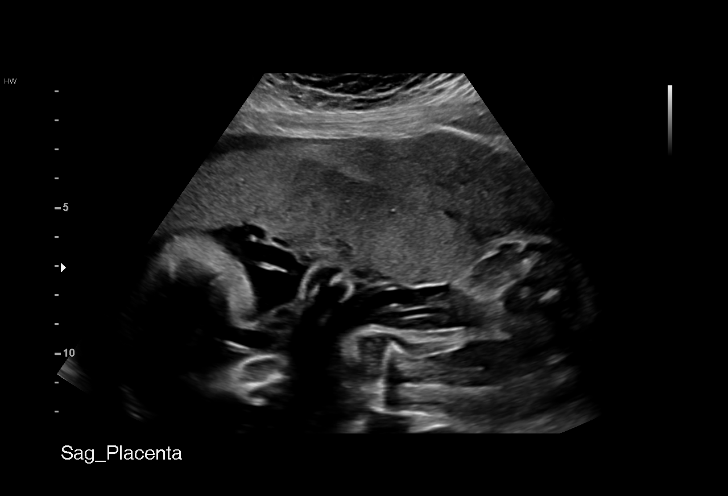
[im 9/33]
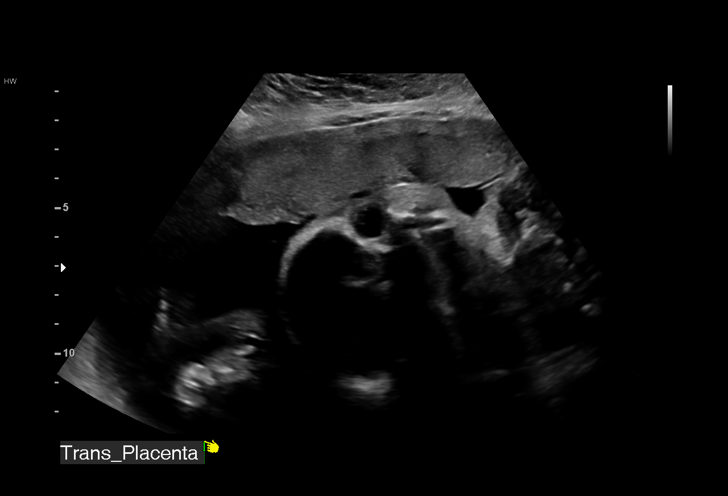
[im 11/33]
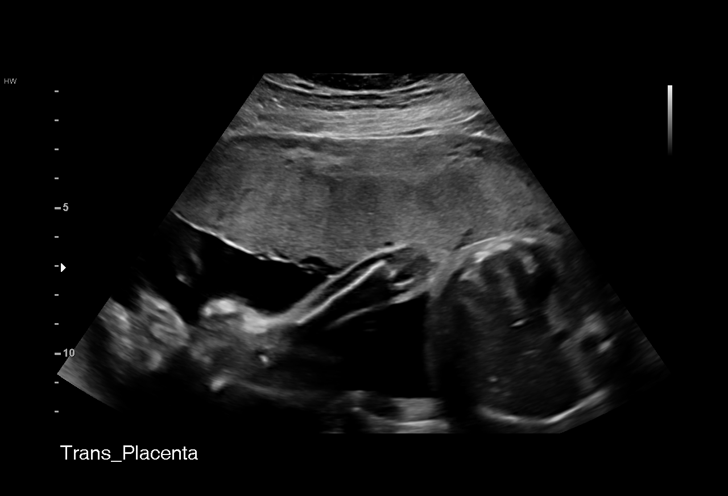
[im 14/33]
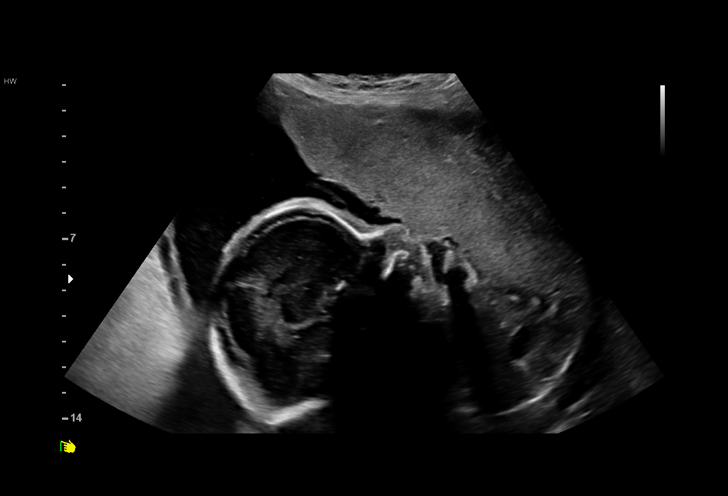
[im 16/33]
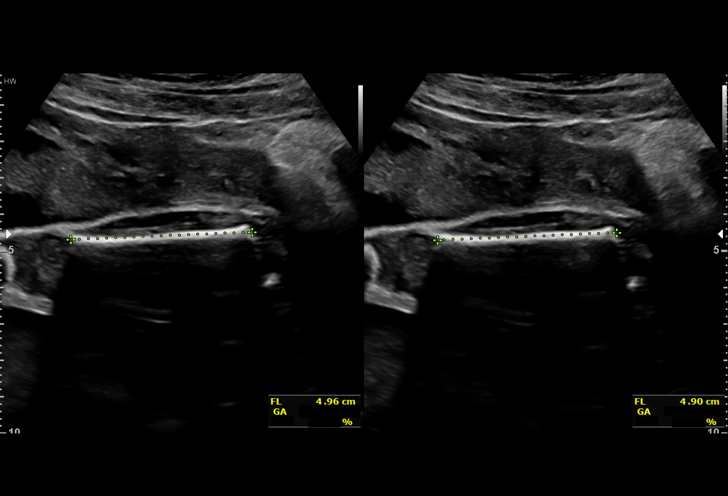
[im 18/33]
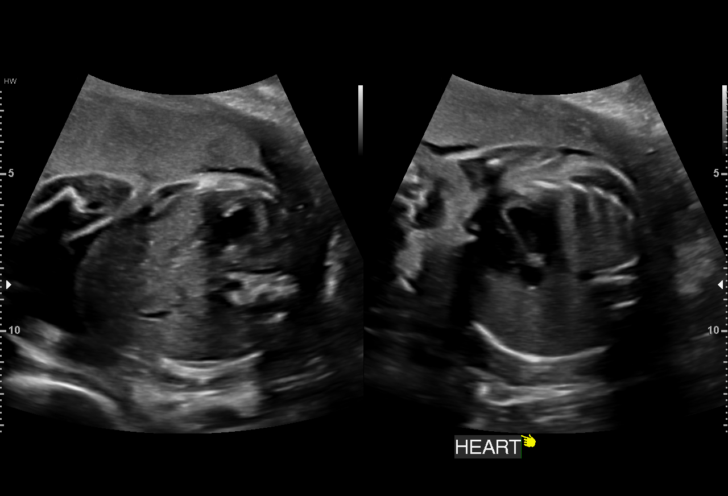
[im 21/33]
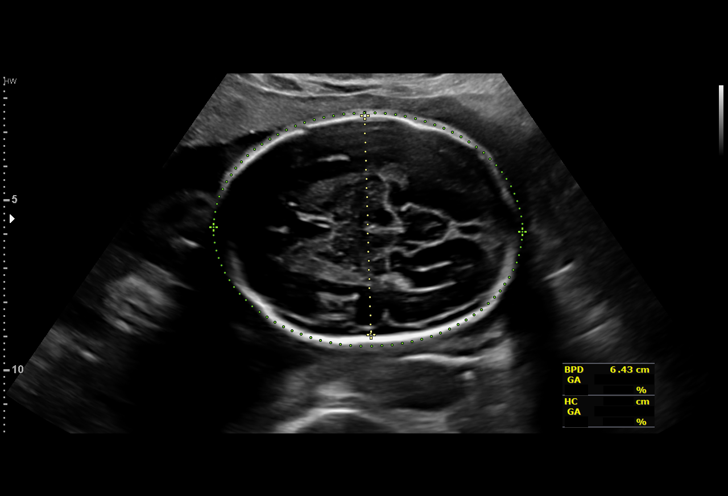
[im 23/33]
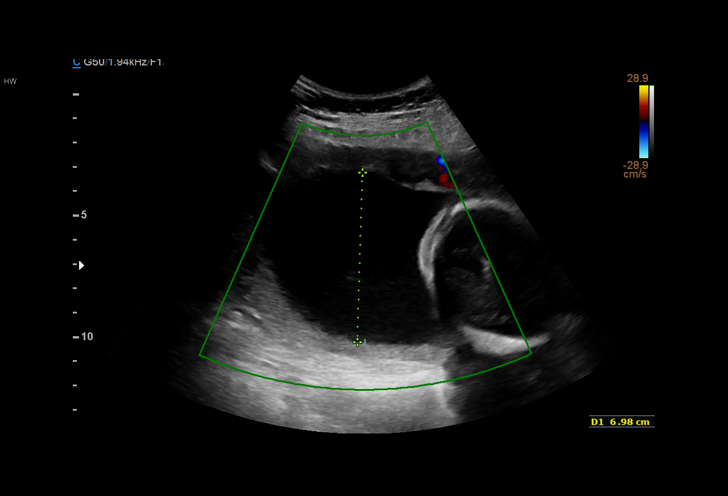
[im 25/33]
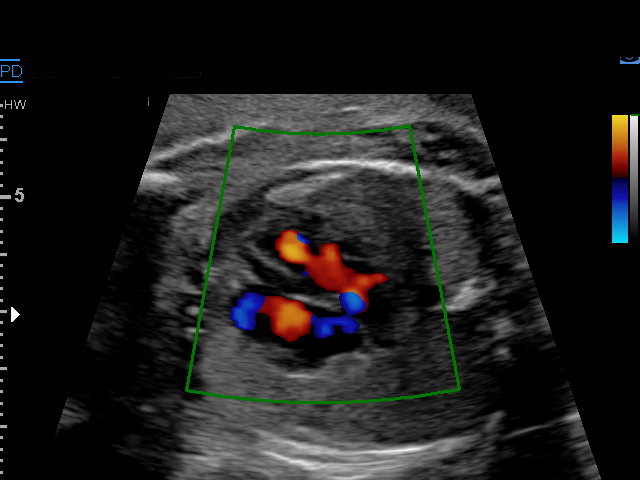
[im 28/33]
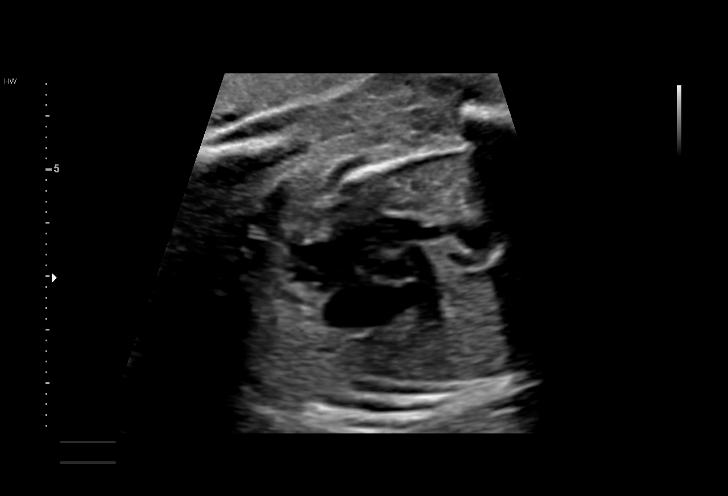
[im 30/33]
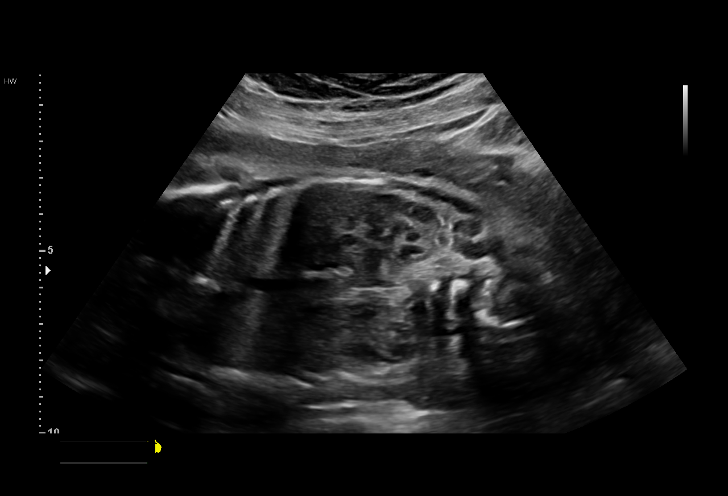
[im 33/33]
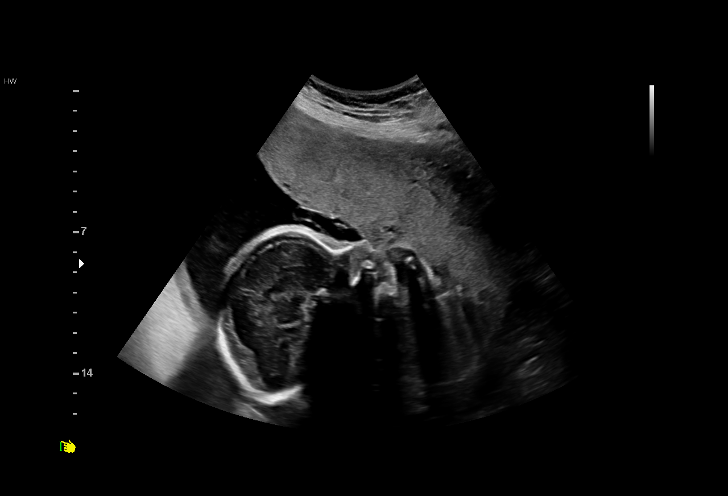

[14 of 28 positions shown; findings below may reference images not displayed]

[facility]

OB/Gyn Clinic

1  DIOSNY PENELAS            [id number]      [id number]     [id number]
Indications

27 weeks gestation of pregnancy
Advanced maternal age multigravida 35+,
second trimester; low risk BEERS
Norimitsu use complicating pregnancy,
second trimester
Hypertension - Chronic/Pre-existing on
labetalol
Encounter for other antenatal screening
follow-up
OB History

Blood Type:            Height:  5'2"   Weight (lb):  151       BMI:
Gravidity:    5         Term:   3        Prem:   0        SAB:   1
TOP:          0       Ectopic:  0        Living: 3
Fetal Evaluation

Num Of Fetuses:     1
Fetal Heart         129
Rate(bpm):
Cardiac Activity:   Observed
Presentation:       Breech
Placenta:           Anterior, above cervical os
P. Cord Insertion:  Visualized, central
Amniotic Fluid
AFI FV:      Subjectively within normal limits

Largest Pocket(cm)
6.9
Biometry

BPD:      64.3  mm     G. Age:  26w 0d         12  %    CI:        67.78   %    70 - 86
FL/HC:      19.7   %    18.6 -
HC:      249.9  mm     G. Age:  27w 1d         26  %    HC/AC:      1.10        1.05 -
AC:      226.5  mm     G. Age:  27w 0d         43  %    FL/BPD:     76.7   %    71 - 87
FL:       49.3  mm     G. Age:  26w 4d         24  %    FL/AC:      21.8   %    20 - 24
HUM:      45.8  mm     G. Age:  27w 0d         48  %

Est. FW:     990  gm      2 lb 3 oz     50  %
Gestational Age

LMP:           27w 0d        Date:  03/07/17                 EDD:   12/12/17
U/S Today:     26w 5d                                        EDD:   12/14/17
Best:          27w 0d     Det. By:  LMP  (03/07/17)          EDD:   12/12/17
Anatomy

Cranium:               Appears normal         Aortic Arch:            Previously seen
Cavum:                 Previously seen        Ductal Arch:            Previously seen
Ventricles:            Appears normal         Diaphragm:              Previously seen
Choroid Plexus:        Previously seen        Stomach:                Appears normal, left
sided
Cerebellum:            Previously seen        Abdomen:                Previously seen
Posterior Fossa:       Previously seen        Abdominal Wall:         Previously seen
Nuchal Fold:           Previously seen        Cord Vessels:           Previously seen
Face:                  Orbits and profile     Kidneys:                Appear normal
previously seen
Lips:                  Previously seen        Bladder:                Appears normal
Thoracic:              Appears normal         Spine:                  Previously seen
Heart:                 Appears normal         Upper Extremities:      Previously seen
(4CH, axis, and
situs)
RVOT:                  Appears normal         Lower Extremities:      Previously seen
LVOT:                  Previously seen

Other:  Female gender. Heels and 5th digit previously visualized. Technically
difficult due to fetal position.
Cervix Uterus Adnexa

Cervix
Length:            3.4  cm.
Normal appearance by transabdominal scan.

Uterus
No abnormality visualized.

Left Ovary
No adnexal mass visualized.

Right Ovary
No adnexal mass visualized.
Cul De Sac:   No free fluid seen.
Adnexa:       No abnormality visualized.
Impression

Singleton intrauterine pregnancy at 27+0 weeks with AMA
and CHTN here for growth evaluation
Interval review of the anatomy shows no sonographic
markers for aneuploidy or structural anomalies
All relevant fetal anatomy has been visualized
Amniotic fluid volume is normal
Estimated fetal weight shows growth in the 50th percentile
Recommendations

Recommend follow-up ultrasound examination in 4 weeks for
growth evaluation

## 2019-12-19 IMAGING — US US MFM OB FOLLOW-UP
1 series · 14 of 28 positions shown · non-contrast
Comparison: none

[Series 1: us mfm ob follow-up · 14 of 39 slices shown]
[im 2/39]
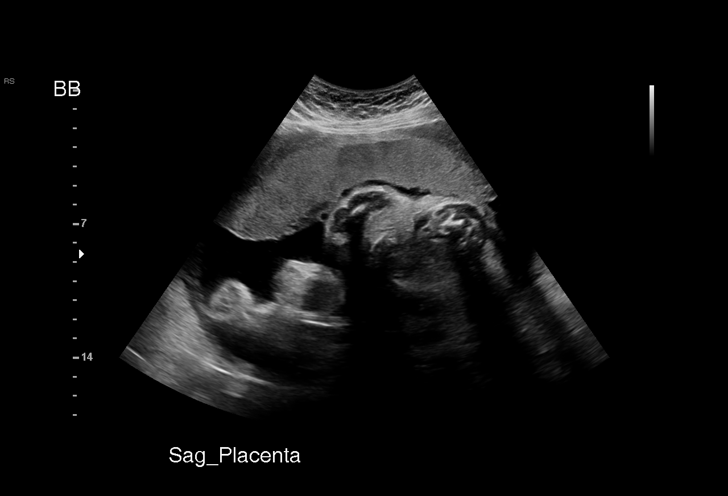
[im 5/39]
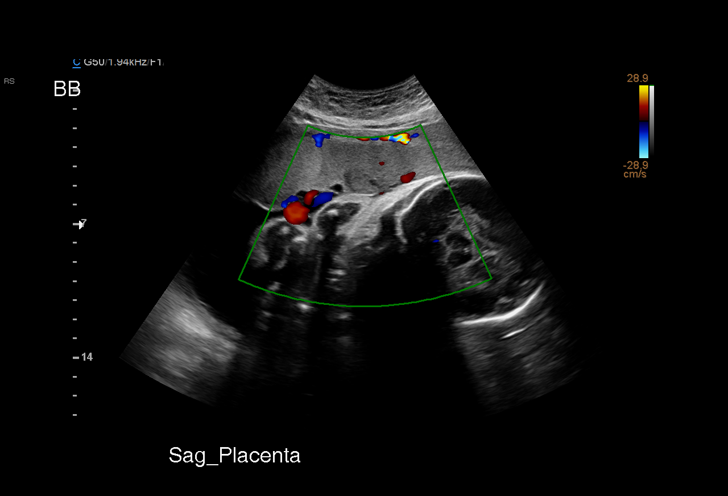
[im 8/39]
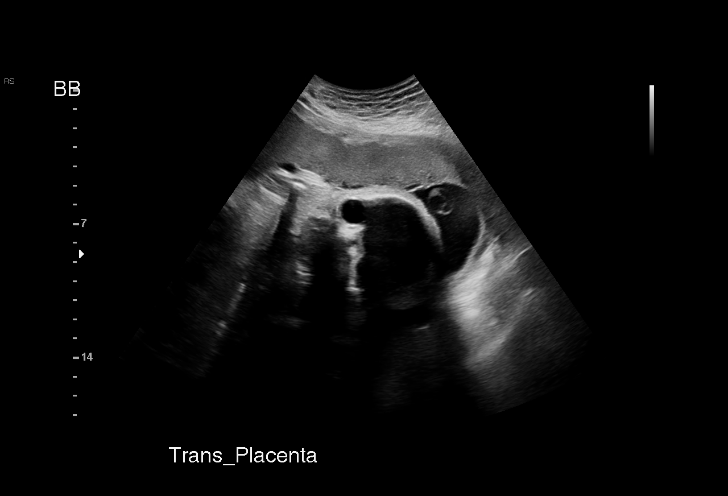
[im 10/39]
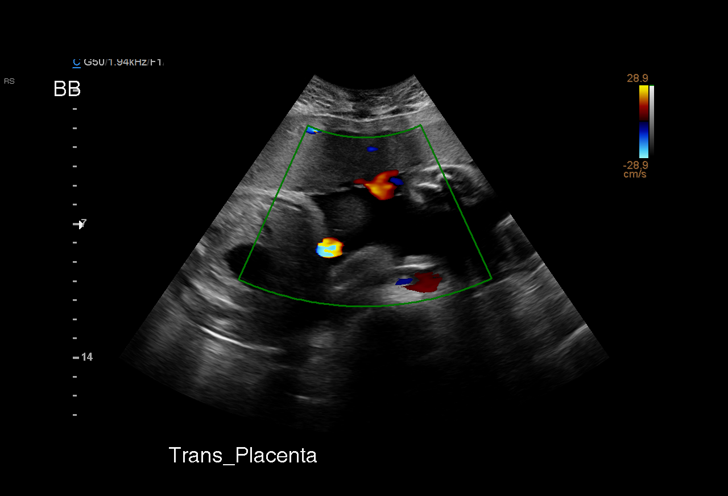
[im 13/39]
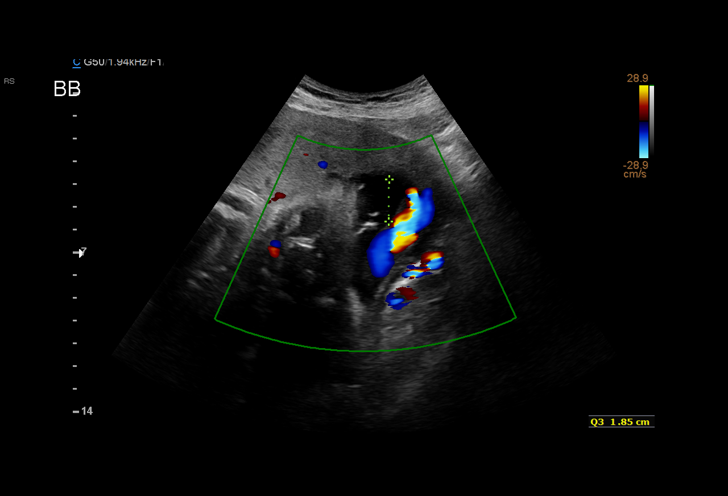
[im 16/39]
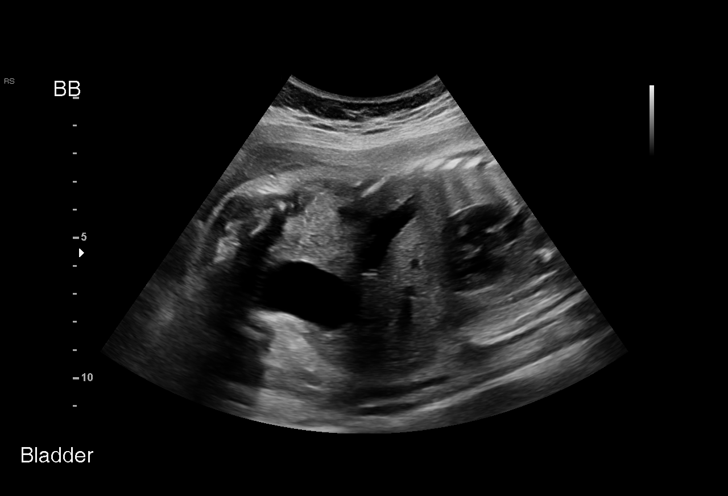
[im 19/39]
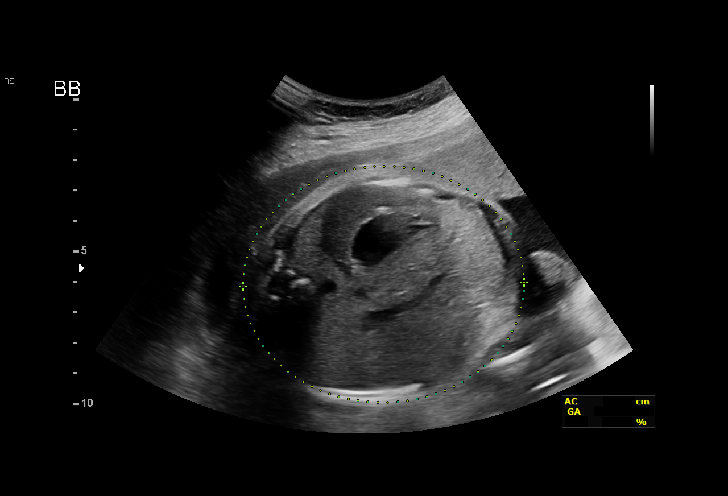
[im 22/39]
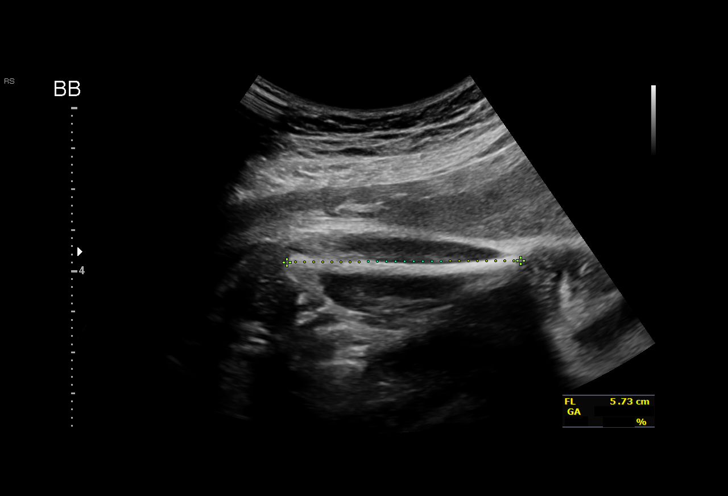
[im 24/39]
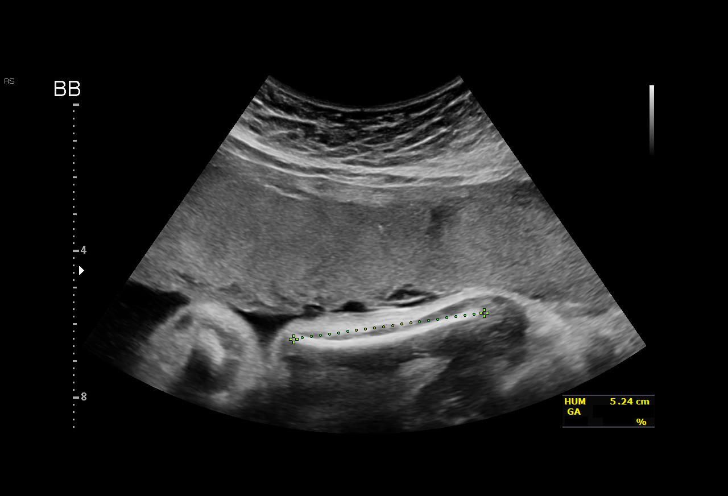
[im 27/39]
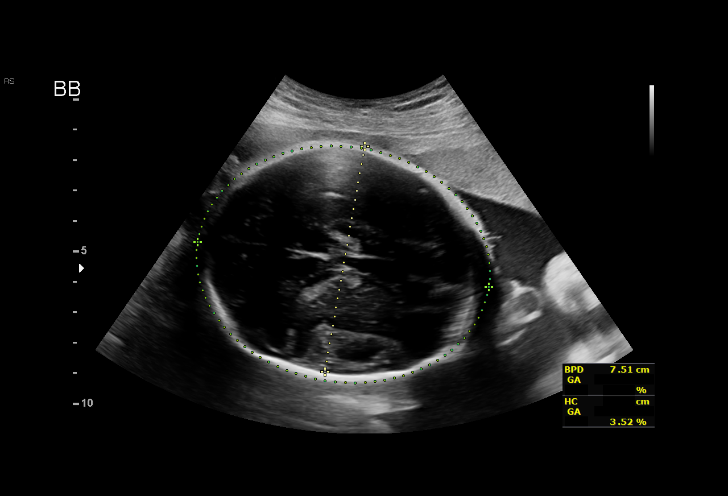
[im 30/39]
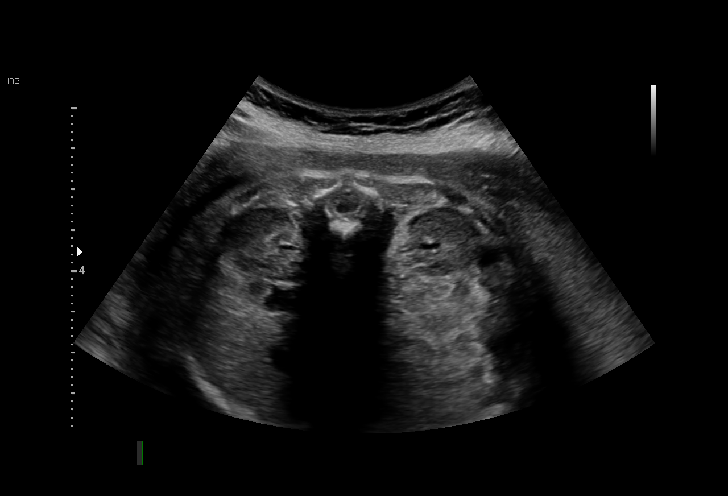
[im 33/39]
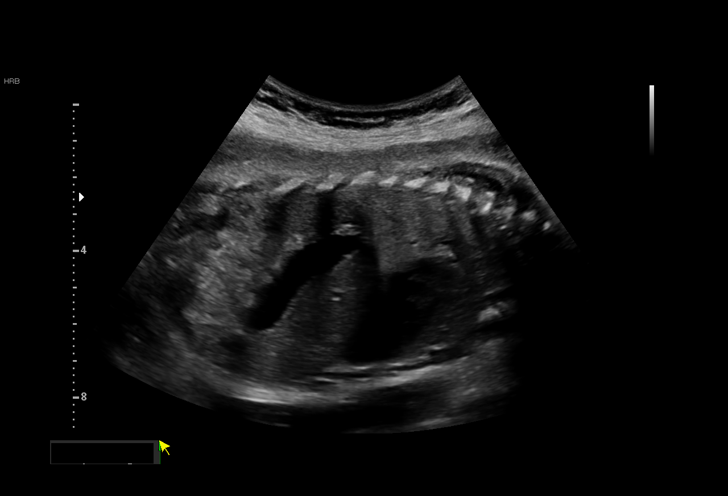
[im 36/39]
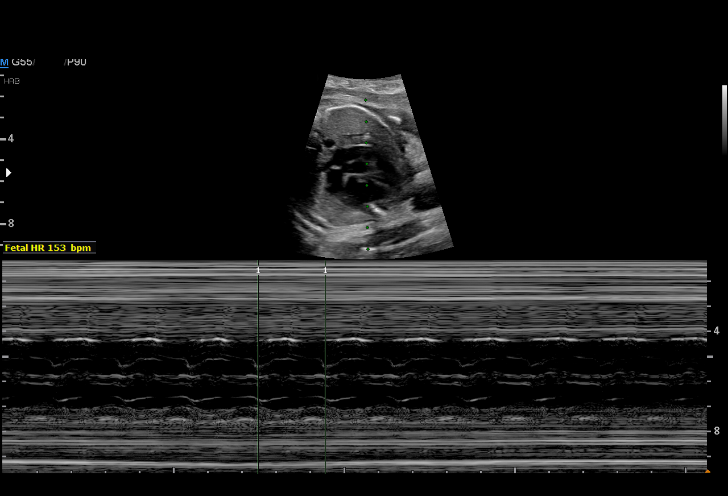
[im 39/39]
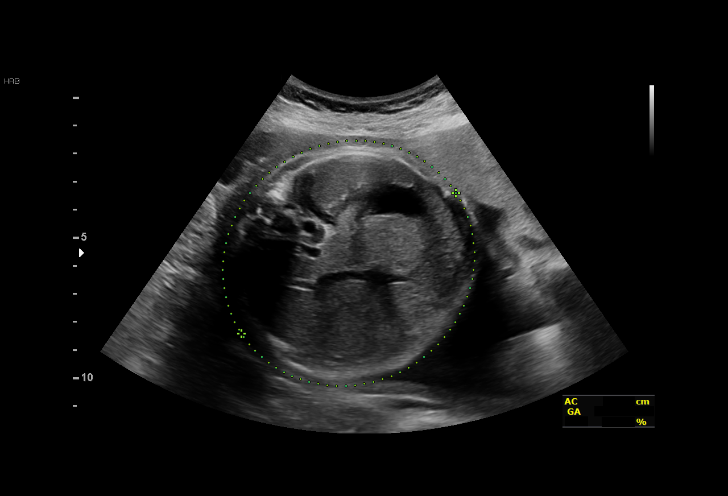

[14 of 28 positions shown; findings below may reference images not displayed]

[facility]

OB/Gyn Clinic

1  LESYA AUJLA              [id number]      [id number]     [id number]
Indications

31 weeks gestation of pregnancy
Advanced maternal age multigravida 35+,
second trimester; low risk BRONSTEIN
Schram use complicating pregnancy,
second trimester
Hypertension - Chronic/Pre-existing on
labetalol
Encounter for other antenatal screening
follow-up
OB History

Blood Type:            Height:  5'2"   Weight (lb):  151       BMI:
Gravidity:    5         Term:   3        Prem:   0        SAB:   1
TOP:          0       Ectopic:  0        Living: 3
Fetal Evaluation

Num Of Fetuses:     1
Fetal Heart         153
Rate(bpm):
Cardiac Activity:   Observed
Presentation:       Cephalic
Placenta:           Anterior, above cervical os
P. Cord Insertion:  Previously Visualized
Amniotic Fluid
AFI FV:      Subjectively within normal limits

AFI Sum(cm)     %Tile       Largest Pocket(cm)
14.26           49

RUQ(cm)       RLQ(cm)       LUQ(cm)        LLQ(cm)
5.97
Biometry

BPD:      75.6  mm     G. Age:  30w 2d         20  %    CI:        71.33   %    70 - 86
FL/HC:      20.0   %    19.3 -
HC:      285.1  mm     G. Age:  31w 2d         23  %    HC/AC:      1.03        0.96 -
AC:      277.8  mm     G. Age:  31w 6d         71  %    FL/BPD:     75.4   %    71 - 87
FL:         57  mm     G. Age:  29w 6d         12  %    FL/AC:      20.5   %    20 - 24
HUM:      52.4  mm     G. Age:  30w 4d         43  %

Est. FW:    4779  gm    3 lb 12 oz      56  %
Gestational Age

LMP:           31w 0d        Date:  03/07/17                 EDD:   12/12/17
U/S Today:     30w 6d                                        EDD:   12/13/17
Best:          31w 0d     Det. By:  LMP  (03/07/17)          EDD:   12/12/17
Anatomy

Cranium:               Appears normal         Aortic Arch:            Previously seen
Cavum:                 Appears normal         Ductal Arch:            Previously seen
Ventricles:            Appears normal         Diaphragm:              Appears normal
Choroid Plexus:        Previously seen        Stomach:                Appears normal, left
sided
Cerebellum:            Previously seen        Abdomen:                Appears normal
Posterior Fossa:       Previously seen        Abdominal Wall:         Previously seen
Nuchal Fold:           Previously seen        Cord Vessels:           Previously seen
Face:                  Orbits and profile     Kidneys:                Appear normal
previously seen
Lips:                  Previously seen        Bladder:                Appears normal
Thoracic:              Appears normal         Spine:                  Previously seen
Heart:                 Appears normal         Upper Extremities:      Previously seen
(4CH, axis, and
situs)
RVOT:                  Previously seen        Lower Extremities:      Previously seen
LVOT:                  Previously seen

Other:  Female gender. Heels and 5th digit previously visualized. Technically
difficult due to fetal position.
Cervix Uterus Adnexa

Cervix
Not visualized (advanced GA >36wks)
Impression

SIUP at 31+0 weeks with cardiac activity
Normal interval anatomy; anatomic survey complete
Normal amniotic fluid volume
Appropriate interval growth with EFW at the 56th %tile
Recommendations

Follow-up ultrasound for growth in 4 weeks

## 2022-12-08 ENCOUNTER — Institutional Professional Consult (permissible substitution) (INDEPENDENT_AMBULATORY_CARE_PROVIDER_SITE_OTHER): Payer: Self-pay | Admitting: Physician Assistant

## 2022-12-08 VITALS — BP 106/75 | HR 77 | Resp 18 | Ht 62.0 in | Wt 150.0 lb

## 2022-12-08 DIAGNOSIS — I712 Thoracic aortic aneurysm, without rupture, unspecified: Secondary | ICD-10-CM

## 2022-12-08 NOTE — Progress Notes (Addendum)
[address]       [place] [address]             [id number]      Julie Ellis [id number] 1980-12-27  History of Present Illness: Julie Ellis is a 42 year old female with a past medical history of hypertension and GERD. On 12/27 she presented to the ED for chest pain that radiated to her right shoulder and upper back. She was not found to have ischemic disease but on CTA for pulmonary embolism she was incidentally found to have a 4.2cm ascending thoracic aortic aneurysm. She previously had a PCP that was prescribing her blood pressure medication but she has not been able to afford her refills so she has not taken her blood pressure medication in 2 months. She does not know of any family history of ascending aortic aneurysms and she denies a personal history of Marfans or Ehlers Danlos.  Today she reports constant chest pain that has been going on for about 6 months that worsens when lying on her side. She also admits to dyspnea with exertion and dizziness that comes and goes. She denies chest tightness, LOC, and lower extremity swelling. She does smoke about 1-2 cigarettes per day.    Current Outpatient Medications on File Prior to Visit  Medication Sig Dispense Refill   ferrous sulfate 325 (65 FE) MG tablet Take 1 tablet (325 mg total) by mouth 2 (two) times daily with a meal. (Patient not taking: Reported on 12/12/2017) 30 tablet 3   HYDROcodone-acetaminophen (NORCO/VICODIN) 5-325 MG tablet Take 1 tablet by mouth every 6 (six) hours as needed for moderate pain. May take with ibuprofen (Patient not taking: Reported on 12/12/2017) 15 tablet 0   ibuprofen (ADVIL,MOTRIN) 600 MG tablet Take 1 tablet (600 mg total) by mouth every 6 (six) hours. 30 tablet 0   lisinopril (PRINIVIL,ZESTRIL) 10 MG tablet Take 1 tablet (10 mg total) by mouth daily. 30 tablet 2   Prenatal Multivit-Min-Fe-FA (PRENATAL VITAMINS) 0.8 MG tablet Take 1 tablet by mouth daily. 30 tablet 12    No current facility-administered medications on file prior to visit.   Vitals: Today's Vitals   12/08/22 1250  BP: 106/75  Pulse: 77  Resp: 18  SpO2: 98%  Weight: 150 lb (68 kg)  Height: 5\' 2"  (1.575 m)   Body mass index is 27.44 kg/m.  Review of Systems  Constitutional:  Positive for malaise/fatigue. Negative for chills, fever and weight loss.  Eyes:  Positive for blurred vision.  Respiratory:  Negative for cough.        Pain with coughing or deep breath  Cardiovascular:  Positive for chest pain, palpitations and orthopnea. Negative for leg swelling.       Shortness of breath with exertion  Gastrointestinal:  Positive for heartburn. Negative for nausea and vomiting.  Musculoskeletal:  Positive for back pain.  Neurological:  Positive for dizziness and headaches. Negative for loss of consciousness.  Endo/Heme/Allergies:  Bruises/bleeds easily.    Physical Exam General: No acute distress Neuro: Grossly intact CV: Regular rate and rhythm, no murmur Pulm: Clear to auscultation Extremities: No edema  CTA Results: Results for orders placed or performed during the hospital encounter of 06/16/22 CTA CHEST (PE STUDY) W CONTRAST Narrative CLINICAL DATA: Chest pain and elevated blood pressure  EXAM: CT ANGIOGRAPHY CHEST WITH CONTRAST  TECHNIQUE: Multidetector CT imaging of the chest was performed using the standard protocol during bolus administration of intravenous contrast. Multiplanar CT  image reconstructions and MIPs were obtained to evaluate the vascular anatomy.  RADIATION DOSE REDUCTION: This exam was performed according to the departmental dose-optimization program which includes automated exposure control, adjustment of the mA and/or kV according to patient size and/or use of iterative reconstruction technique.  CONTRAST: 80 mL Omnipaque 350  COMPARISON: Chest radiographs earlier today  FINDINGS: Cardiovascular: Satisfactory opacification of the pulmonary  arteries to the segmental level. No evidence of pulmonary embolism. Cardiomegaly. No pericardial effusion. The ascending aorta is enlarged measuring approximately 42 mm in maximum diameter (coronal image 45).  Mediastinum/Nodes: Hazy stranding in the anterior mediastinum is indeterminate but may represent residual thymic tissue. No enlarged mediastinal, hilar, or axillary lymph nodes. Thyroid gland, trachea, and esophagus demonstrate no significant findings.  Lungs/Pleura: Mosaic attenuation in the lungs compatible with air trapping. Mild bronchial wall thickening. No focal consolidation, pleural effusion, or pneumothorax.  Upper Abdomen: No acute abnormality.  Musculoskeletal: No chest wall abnormality. No acute osseous findings.  Review of the MIP images confirms the above findings.  IMPRESSION: 1. Negative for acute pulmonary embolism. 2. Ascending thoracic aortic aneurysm measuring 42 mm. Recommend annual imaging followup by CTA or MRA. This recommendation follows 2010 ACCF/AHA/AATS/ACR/ASA/SCA/SCAI/SIR/STS/SVM Guidelines for the Diagnosis and Management of Patients with Thoracic Aortic Disease. Circulation. 2010; 121: Z610-R604. Aortic aneurysm NOS (ICD10-I71.9) 3. Air trapping in the lungs compatible with small airway infection/inflammation.  Electronically Signed By: Minerva Fester M.D. On: 06/16/2022 22:38  A/P: Ascending thoracic aortic aneurysm: Julie Ellis presents with a 4.2cm cm ascending aortic aneurysm.  She has not had an echocardiogram. We discussed the natural history and and risk factors for growth of ascending aortic aneurysms. We covered the importance of smoking cessation, tight blood pressure control, refraining from lifting heavy objects, and avoiding fluoroquinolones. The patient is aware of signs and symptoms of aortic dissection and when to present to the emergency department. The patient does complain of chest pain and SOB, ED workup without  sign of ischemia, encouraged her to continue close follow up with PCP. We will continue surveillance 1 year from her last CTA scan in December with a new CTA and echocardiogram.   Hypertension: She is now taking Lisinopril 20mg  daily and Amlodipine 10mg  daily. BP is well controlled in office today, continue to closely follow up with PCP.  Hx of breast mass, now with new discharge: Pt states she was instructed to call to set up a mammogram and has not heard back, encouraged patient to follow up.  Risk Modification Reviewed:  Statin:  Defer to primary care once she is established  Smoking cessation instruction/counseling given: Discussed the importance of quitting and reviewed tactics to help her quit  Patient was counseled on importance of Blood Pressure Control.  Despite Medical intervention if the patient notices persistently elevated blood pressure readings they are instructed to contact their Primary Care Physician  Please avoid use of Fluoroquinolones as this can potentially increase your risk of Aortic Rupture and/or Dissection  Patient educated on signs and symptoms of Aortic Dissection, handout also provided in AVS   Jenny Reichmann, PA-C 12/08/22

## 2022-12-08 NOTE — Patient Instructions (Addendum)
Modificacin del riesgo en personas con aneurisma de la aorta torcica ascendente:  1. Continuar con un buen control de la presin arterial (preferir 130/80 o menos)  2. Evite los antibiticos de fluoroquinolona (ciprofloxacina, avelox, levofloxacina, ofloxacina)  3. Uso de estatinas (para disminuir el riesgo cardiovascular)  4. Las limitaciones de ejercicio y [place] son individualizadas, Biomedical engineer en general, los deportes de contacto deben ser  Automotive engineer y se debe Medical illustrator objetos pesados (definido como la mitad del peso corporal ideal) y Ejercicios que implican maniobra de Valsalva sostenida.  5. Asesoramiento a personas sospechosas de Chiropodist de origen gentico. Primer grado Los familiares de personas con enfermedad TAA deben ser examinados, as como aquellos que tiene una enfermedad del tejido Designer, fashion/clothing (es Designer, jewellery, con sndrome de Marfan, sndrome de Ehlers-Danlos,  y sndrome de Loeys-Dietz) o una vlvula artica bicspide, tienen un mayor riesgo de  complicaciones relacionadas con TAA  6. Si uno abusa del tabaco, se recomienda encarecidamente que deje de fumar.

## 2022-12-21 ENCOUNTER — Telehealth: Payer: Self-pay

## 2022-12-21 NOTE — Telephone Encounter (Addendum)
This pt has called requesting to schedule an appt for a mammogram and PAP smear. I have advise I will message scheduling to contact her directly.  Pt has been advised we have a [facility] location and would like to be scheduled there.  Pt shared that she has previously had a diagnostic mammo and bx and was determined to have a cyst. Pt states she has not completed a mammo in 8 years and knows she is supposed to begin completing them annually given her age.  Last PAP was in 2016 at [facility].   Best number to reach the pt at is [phone].

## 2022-12-21 NOTE — Telephone Encounter (Signed)
Patient has called again. I asked if she left a message with the scheduling department. She advised she did not. I have encouraged her to do so and I have advised her that I have also sent a message with her request as well. Patient was satisfied with this outcome.

## 2023-01-04 ENCOUNTER — Other Ambulatory Visit: Payer: Self-pay

## 2023-01-04 DIAGNOSIS — N6310 Unspecified lump in the right breast, unspecified quadrant: Secondary | ICD-10-CM

## 2023-01-20 ENCOUNTER — Ambulatory Visit
Admission: RE | Admit: 2023-01-20 | Discharge: 2023-01-20 | Disposition: A | Discharge status: Home / Self Care | Payer: Self-pay | Source: Ambulatory Visit | Attending: Obstetrics and Gynecology | Admitting: Obstetrics and Gynecology

## 2023-01-20 ENCOUNTER — Ambulatory Visit: Payer: Self-pay | Admitting: Hematology and Oncology

## 2023-01-20 VITALS — BP 108/90 | Wt 150.0 lb

## 2023-01-20 DIAGNOSIS — N6310 Unspecified lump in the right breast, unspecified quadrant: Secondary | ICD-10-CM

## 2023-01-20 NOTE — Patient Instructions (Signed)
Taught Julie Ellis about self breast awareness and gave educational materials to take home. Patient did not need a Pap smear today due to last Pap smear was in 08/25/2021 per patient. Let her know [facility] will cover Pap smears every 5 years unless has a history of abnormal Pap smears. Referred patient to the [facility] for diagnostic mammogram. Appointment scheduled for 01/20/2023. Patient aware of appointment and will be there. Let patient know will follow up with her within the next couple weeks with results. Julie Ellis verbalized understanding.  Pascal Lux, NP 9:21 AM

## 2023-01-20 NOTE — Progress Notes (Signed)
Julie Ellis is Ellis 42 y.o. female who presents to [facility] clinic today with complaint of right breast mass.    Pap Smear: Pap not smear completed today. Last Pap smear was 08/25/2021 and was normal. Per patient has no history of an abnormal Pap smear. Last Pap smear result is available in Epic.   Physical exam: Breasts Breasts symmetrical. No skin abnormalities bilateral breasts. No nipple retraction bilateral breasts. No nipple discharge bilateral breasts. No lymphadenopathy. No lumps palpated left breast. Right breast with small lump noted at an old cyst aspiration site.     Pelvic/Bimanual Pap is not indicated today    Smoking History: Patient is Ellis current smoker at approximately 4-5 cigarettes per day and was referred to quit line.    Patient Navigation: Patient education provided. Access to services provided for patient through [facility] program. Julie Ellis interpreter provided. No transportation provided   Colorectal Cancer Screening: Per patient has never had colonoscopy completed No complaints today.    Breast and Cervical Cancer Risk Assessment: Patient has family history of breast cancer, with her maternal aunt who was diagnosed around 77 years of age and is currently deceased. Patient does not have history of cervical dysplasia, immunocompromised, or DES exposure in-utero.  Risk Assessment   No risk assessment data     Ellis: [facility] exam without pap smear Complaint of right breast lump. This is near the site of Ellis cyst aspiration that occurred several years ago.   P: Referred patient to the [facility] for Ellis diagnostic mammogram. Appointment scheduled 01/20/2023.  Julie Basset A, NP 01/20/2023 9:20 AM

## 2023-05-03 ENCOUNTER — Other Ambulatory Visit: Payer: Self-pay | Admitting: Surgery

## 2023-05-03 DIAGNOSIS — I712 Thoracic aortic aneurysm, without rupture, unspecified: Secondary | ICD-10-CM

## 2023-06-06 ENCOUNTER — Inpatient Hospital Stay: Admission: RE | Admit: 2023-06-06 | Payer: Self-pay | Source: Ambulatory Visit

## 2023-06-13 ENCOUNTER — Ambulatory Visit: Payer: Self-pay

## 2023-06-13 ENCOUNTER — Other Ambulatory Visit: Payer: Self-pay

## 2023-06-16 ENCOUNTER — Ambulatory Visit: Payer: Self-pay

## 2023-06-16 ENCOUNTER — Ambulatory Visit (HOSPITAL_COMMUNITY): Payer: Self-pay | Attending: Surgery

## 2023-06-17 ENCOUNTER — Encounter (HOSPITAL_COMMUNITY): Payer: Self-pay | Admitting: Radiology

## 2023-06-29 NOTE — Progress Notes (Deleted)
 [address]       [place] [address]             [phone]   PCP is Patient, No Pcp Per Referring Provider is No ref. provider found  Chief Complaint: Ascending thoracic aortic aneurysm   HPI: This is a 43 year old female with a past medical history of hypertension, tobacco abuse, and GERD who was Incidentally found to have an ascending thoracic aortic aneurysm in June 2024. The ATAA measured 42 mm at that time. She presents today for further surveillance. She denies chest pain, pressure, or tightness.  Past Medical History:  Diagnosis Date   Breast cyst, right 05/23/2017   Hypertension    took herself off meds 1 1/2years ago    Past Surgical History:  Procedure Laterality Date   CESAREAN SECTION N/A 11/02/2017   Procedure: CESAREAN SECTION;  Surgeon: Levie Heritage, DO;  Location: [facility];  Service: Obstetrics;  Laterality: N/A;   DILATION AND CURETTAGE OF UTERUS  02/2016    Family History  Problem Relation Age of Onset   Epilepsy Mother    Breast cancer Maternal Aunt 7    Social History Social History   Tobacco Use   Smoking status: Former    Current packs/day: 0.50    Types: Cigarettes   Smokeless tobacco: Never  Vaping Use   Vaping status: Never Used  Substance Use Topics   Alcohol use: No   Drug use: No    Current Outpatient Medications  Medication Sig Dispense Refill   amLODipine (NORVASC) 10 MG tablet Take 1 tablet by mouth daily.     lisinopril (PRINIVIL,ZESTRIL) 10 MG tablet Take 1 tablet (10 mg total) by mouth daily. 30 tablet 2   medroxyPROGESTERone (DEPO-PROVERA) 150 MG/ML injection Inject 1 mL every 3 months by intramuscular route.     pantoprazole (PROTONIX) 40 MG tablet TAKE 1 TABLET BY MOUTH ONCE DAILY BEFORE MEAL(S)    Allergies: No Known Allergies   Vital Signs:  Physical Exam: CV- Neck- Pulmonary- Abdomen- Extremities- Neurologic-   Diagnostic Tests:   Impression and Plan: Risk  Modification in those with ascending thoracic aortic aneurysm:  Continue good control of blood pressure (prefer SBP 130/80 or less)-continue Lisinopril (Zestril) and Amlodipine (Norvasc)  2. Avoid fluoroquinolone antibiotics (I.e Ciprofloxacin, Avelox, Levofloxacin, Ofloxacin)  3.  Use of statin (to decrease cardiovascular risk)-will defer to PCP if/when to initiate  4.  Exercise and activity limitations is individualized, but in general, contact sports are to be  avoided and one should avoid heavy lifting (defined as half of ideal body weight) and exercises involving sustained Valsalva maneuver.  5. Counseling for those suspected of having genetically mediated disease. First-degree relatives of those with TAA disease should be screened as well as those who have a connective tissue disease (I.e with Marfan syndrome, Ehlers-Danlos syndrome,  and Loeys-Dietz syndrome) or a  bicuspid aortic valve,have an increased risk for  complications related to TAA. Patient does not have a family history of connective tissue disease.  6. If one has tobacco abuse, smoking cessation is highly encouraged.   Impression and Plan: CTA  with a *** cm ascending aortic aneurysm.  E We discussed the natural history and and risk factors for growth of ascending aortic aneurysms.  We covered the importance of smoking cessation, tight blood pressure control, refraining from lifting heavy objects, and avoiding fluoroquinolones.  The patient is aware of signs and symptoms of aortic dissection and  when to present to the emergency department.  We will continue surveillance and a repeat ** was ordered for ***.    Ardelle Balls, PA-C [facility] [phone]

## 2023-07-01 ENCOUNTER — Telehealth (HOSPITAL_COMMUNITY): Payer: Self-pay | Admitting: Surgery

## 2023-07-01 NOTE — Telephone Encounter (Signed)
 Just an FYI. We have made several attempts to contact this patient including sending a letter to schedule or reschedule their echocardiogram. We will be removing the patient from the echo WQ.   06/17/23 @1145  Call can not be completed at this time - Letter sent EVD  06/17/23@1145  No show for echo/EVD  05/06/23 call could not be completed at this time @ 10:25/LBW  05/03/23 call could not be completed at this time @ 10:50/LBW      Thank you

## 2023-07-04 ENCOUNTER — Other Ambulatory Visit: Payer: Self-pay

## 2023-07-13 ENCOUNTER — Ambulatory Visit: Payer: Self-pay

## 2023-07-18 NOTE — Progress Notes (Deleted)
      [address]       [place] [address]             [phone]        Julie Ellis [id number] [birth year]  History of Present Illness: Julie Ellis is a 43 year old female with a past medical history of hypertension and GERD. In 12/23 she presented to the ED for chest pain that radiated to her right shoulder and upper back. She was not found to have ischemic disease but on CTA for pulmonary embolism she was incidentally found to have a 4.2cm ascending thoracic aortic aneurysm   She denies family history of TAA and personal history of connective tissue disorder.  Today she reports ***   Current Outpatient Medications on File Prior to Visit  Medication Sig Dispense Refill   amLODipine (NORVASC) 10 MG tablet Take 1 tablet by mouth daily.     lisinopril (PRINIVIL,ZESTRIL) 10 MG tablet Take 1 tablet (10 mg total) by mouth daily. 30 tablet 2   medroxyPROGESTERone (DEPO-PROVERA) 150 MG/ML injection Inject 1 mL every 3 months by intramuscular route.     pantoprazole (PROTONIX) 40 MG tablet TAKE 1 TABLET BY MOUTH ONCE DAILY BEFORE MEAL(S)     No current facility-administered medications on file prior to visit.   Vitals:  Physical Exam General Neuro CV Pulm GI Extremities  CTA Results:      Impression and Plan: TAA: Julie Ellis presents to the clinic with a *** cm ascending aortic aneurysm.  Echocardiogram shows a *** valve without evidence of regurgitation.  We discussed the natural history and and risk factors for growth of ascending aortic aneurysms.  We covered the importance of smoking cessation, tight blood pressure control, refraining from lifting heavy objects, and avoiding fluoroquinolones.  The patient is aware of signs and symptoms of aortic dissection and when to present to the emergency department.  We will continue surveillance and a repeat ** was ordered for ***.  HTN:    Risk Modification:  Statin:   ***  Smoking cessation instruction/counseling given:  {CHL AMB [facility] SMOKING CESSATION COUNSELING:20758}1-2 cigarettes per day  Patient was counseled on importance of Blood Pressure Control.  Despite Medical intervention if the patient notices persistently elevated blood pressure readings.  They are instructed to contact their Primary Care Physician  Please avoid use of Fluoroquinolones as this can potentially increase your risk of Aortic Rupture and/or Dissection  Patient educated on signs and symptoms of Aortic Dissection, handout also provided in AVS  Jenny Reichmann, PA-C 07/18/23

## 2023-07-29 ENCOUNTER — Inpatient Hospital Stay: Admission: RE | Admit: 2023-07-29 | Payer: Self-pay | Source: Ambulatory Visit

## 2023-08-01 ENCOUNTER — Ambulatory Visit: Payer: Self-pay
